# Patient Record
Sex: Female | Born: 1970 | Race: White | Hispanic: No | Marital: Married | Smoking: Never smoker
Health system: Southern US, Community
[De-identification: ages and names within clinical notes are randomized; demographics above are authoritative.]

## PROBLEM LIST (undated history)

## (undated) DIAGNOSIS — T7840XA Allergy, unspecified, initial encounter: Secondary | ICD-10-CM

## (undated) DIAGNOSIS — A048 Other specified bacterial intestinal infections: Secondary | ICD-10-CM

## (undated) DIAGNOSIS — G43909 Migraine, unspecified, not intractable, without status migrainosus: Secondary | ICD-10-CM

## (undated) DIAGNOSIS — E669 Obesity, unspecified: Secondary | ICD-10-CM

## (undated) DIAGNOSIS — I10 Essential (primary) hypertension: Secondary | ICD-10-CM

## (undated) DIAGNOSIS — K219 Gastro-esophageal reflux disease without esophagitis: Secondary | ICD-10-CM

## (undated) DIAGNOSIS — N72 Inflammatory disease of cervix uteri: Secondary | ICD-10-CM

## (undated) DIAGNOSIS — E079 Disorder of thyroid, unspecified: Secondary | ICD-10-CM

## (undated) DIAGNOSIS — D649 Anemia, unspecified: Secondary | ICD-10-CM

## (undated) DIAGNOSIS — E119 Type 2 diabetes mellitus without complications: Secondary | ICD-10-CM

## (undated) DIAGNOSIS — R1031 Right lower quadrant pain: Secondary | ICD-10-CM

## (undated) DIAGNOSIS — J45909 Unspecified asthma, uncomplicated: Secondary | ICD-10-CM

## (undated) DIAGNOSIS — E785 Hyperlipidemia, unspecified: Secondary | ICD-10-CM

## (undated) HISTORY — DX: Allergy, unspecified, initial encounter: T78.40XA

## (undated) HISTORY — DX: Right lower quadrant pain: R10.31

## (undated) HISTORY — DX: Unspecified asthma, uncomplicated: J45.909

## (undated) HISTORY — DX: Obesity, unspecified: E66.9

## (undated) HISTORY — DX: Essential (primary) hypertension: I10

## (undated) HISTORY — PX: UMBILICAL HERNIA REPAIR: SHX196

## (undated) HISTORY — PX: APPENDECTOMY: SHX54

## (undated) HISTORY — DX: Disorder of thyroid, unspecified: E07.9

## (undated) HISTORY — DX: Hyperlipidemia, unspecified: E78.5

## (undated) HISTORY — DX: Type 2 diabetes mellitus without complications: E11.9

## (undated) HISTORY — PX: HERNIA REPAIR: SHX51

## (undated) HISTORY — DX: Anemia, unspecified: D64.9

## (undated) HISTORY — DX: Inflammatory disease of cervix uteri: N72

## (undated) HISTORY — DX: Migraine, unspecified, not intractable, without status migrainosus: G43.909

---

## 1997-11-24 ENCOUNTER — Other Ambulatory Visit: Admission: RE | Admit: 1997-11-24 | Discharge: 1997-11-24 | Payer: Self-pay | Admitting: Obstetrics and Gynecology

## 1997-12-08 ENCOUNTER — Other Ambulatory Visit: Admission: RE | Admit: 1997-12-08 | Discharge: 1997-12-08 | Payer: Self-pay | Admitting: Obstetrics and Gynecology

## 1999-01-20 ENCOUNTER — Other Ambulatory Visit: Admission: RE | Admit: 1999-01-20 | Discharge: 1999-01-20 | Payer: Self-pay | Admitting: Obstetrics and Gynecology

## 1999-03-01 ENCOUNTER — Emergency Department (HOSPITAL_COMMUNITY): Admission: EM | Admit: 1999-03-01 | Discharge: 1999-03-01 | Payer: Self-pay | Admitting: *Deleted

## 1999-03-01 ENCOUNTER — Encounter: Payer: Self-pay | Admitting: Emergency Medicine

## 1999-06-13 HISTORY — PX: LAPAROSCOPY: SHX197

## 1999-10-04 ENCOUNTER — Encounter (INDEPENDENT_AMBULATORY_CARE_PROVIDER_SITE_OTHER): Payer: Self-pay | Admitting: Specialist

## 1999-10-04 ENCOUNTER — Ambulatory Visit (HOSPITAL_COMMUNITY): Admission: RE | Admit: 1999-10-04 | Discharge: 1999-10-04 | Payer: Self-pay | Admitting: Obstetrics and Gynecology

## 1999-12-20 ENCOUNTER — Encounter: Admission: RE | Admit: 1999-12-20 | Discharge: 2000-03-19 | Payer: Self-pay | Admitting: Obstetrics and Gynecology

## 2000-03-22 ENCOUNTER — Other Ambulatory Visit: Admission: RE | Admit: 2000-03-22 | Discharge: 2000-03-22 | Payer: Self-pay | Admitting: Obstetrics and Gynecology

## 2000-07-26 ENCOUNTER — Encounter: Admission: RE | Admit: 2000-07-26 | Discharge: 2000-10-24 | Payer: Self-pay | Admitting: Obstetrics and Gynecology

## 2000-09-17 ENCOUNTER — Ambulatory Visit (HOSPITAL_COMMUNITY): Admission: RE | Admit: 2000-09-17 | Discharge: 2000-09-17 | Payer: Self-pay | Admitting: Obstetrics and Gynecology

## 2000-09-17 ENCOUNTER — Encounter: Payer: Self-pay | Admitting: Obstetrics and Gynecology

## 2000-09-21 ENCOUNTER — Ambulatory Visit (HOSPITAL_COMMUNITY): Admission: RE | Admit: 2000-09-21 | Discharge: 2000-09-21 | Payer: Self-pay | Admitting: Obstetrics and Gynecology

## 2000-09-21 ENCOUNTER — Encounter: Payer: Self-pay | Admitting: Obstetrics and Gynecology

## 2001-02-01 ENCOUNTER — Inpatient Hospital Stay (HOSPITAL_COMMUNITY): Admission: AD | Admit: 2001-02-01 | Discharge: 2001-02-04 | Payer: Self-pay | Admitting: Obstetrics and Gynecology

## 2001-02-01 ENCOUNTER — Inpatient Hospital Stay (HOSPITAL_COMMUNITY): Admission: AD | Admit: 2001-02-01 | Discharge: 2001-02-01 | Payer: Self-pay | Admitting: Obstetrics and Gynecology

## 2001-02-06 ENCOUNTER — Encounter: Admission: RE | Admit: 2001-02-06 | Discharge: 2001-03-08 | Payer: Self-pay | Admitting: Obstetrics and Gynecology

## 2001-03-18 ENCOUNTER — Other Ambulatory Visit: Admission: RE | Admit: 2001-03-18 | Discharge: 2001-03-18 | Payer: Self-pay | Admitting: Obstetrics and Gynecology

## 2001-08-06 ENCOUNTER — Encounter: Admission: RE | Admit: 2001-08-06 | Discharge: 2001-11-04 | Payer: Self-pay | Admitting: Obstetrics and Gynecology

## 2001-08-12 ENCOUNTER — Emergency Department (HOSPITAL_COMMUNITY): Admission: EM | Admit: 2001-08-12 | Discharge: 2001-08-13 | Payer: Self-pay | Admitting: *Deleted

## 2002-03-20 ENCOUNTER — Other Ambulatory Visit: Admission: RE | Admit: 2002-03-20 | Discharge: 2002-03-20 | Payer: Self-pay | Admitting: Obstetrics and Gynecology

## 2003-04-17 ENCOUNTER — Other Ambulatory Visit: Admission: RE | Admit: 2003-04-17 | Discharge: 2003-04-17 | Payer: Self-pay | Admitting: Obstetrics and Gynecology

## 2004-06-30 ENCOUNTER — Other Ambulatory Visit: Admission: RE | Admit: 2004-06-30 | Discharge: 2004-06-30 | Payer: Self-pay | Admitting: Obstetrics and Gynecology

## 2004-10-27 ENCOUNTER — Encounter: Admission: RE | Admit: 2004-10-27 | Discharge: 2005-01-25 | Payer: Self-pay | Admitting: Family Medicine

## 2005-07-06 ENCOUNTER — Other Ambulatory Visit: Admission: RE | Admit: 2005-07-06 | Discharge: 2005-07-06 | Payer: Self-pay | Admitting: Obstetrics and Gynecology

## 2005-10-10 HISTORY — PX: DILATION AND CURETTAGE OF UTERUS: SHX78

## 2005-10-19 ENCOUNTER — Ambulatory Visit (HOSPITAL_COMMUNITY): Admission: RE | Admit: 2005-10-19 | Discharge: 2005-10-19 | Payer: Self-pay | Admitting: Obstetrics and Gynecology

## 2005-10-19 ENCOUNTER — Encounter (INDEPENDENT_AMBULATORY_CARE_PROVIDER_SITE_OTHER): Payer: Self-pay | Admitting: Specialist

## 2006-08-31 ENCOUNTER — Inpatient Hospital Stay (HOSPITAL_COMMUNITY): Admission: RE | Admit: 2006-08-31 | Discharge: 2006-09-02 | Payer: Self-pay | Admitting: Obstetrics and Gynecology

## 2006-09-07 ENCOUNTER — Ambulatory Visit: Payer: Self-pay | Admitting: Vascular Surgery

## 2006-09-07 ENCOUNTER — Ambulatory Visit (HOSPITAL_COMMUNITY): Admission: RE | Admit: 2006-09-07 | Discharge: 2006-09-07 | Payer: Self-pay | Admitting: *Deleted

## 2006-09-07 ENCOUNTER — Encounter: Payer: Self-pay | Admitting: Vascular Surgery

## 2007-11-21 ENCOUNTER — Encounter: Admission: RE | Admit: 2007-11-21 | Discharge: 2007-11-21 | Payer: Self-pay | Admitting: Family Medicine

## 2008-01-28 ENCOUNTER — Ambulatory Visit (HOSPITAL_COMMUNITY): Admission: RE | Admit: 2008-01-28 | Discharge: 2008-01-28 | Payer: Self-pay | Admitting: Gastroenterology

## 2008-02-04 ENCOUNTER — Ambulatory Visit (HOSPITAL_COMMUNITY): Admission: RE | Admit: 2008-02-04 | Discharge: 2008-02-04 | Payer: Self-pay | Admitting: Gastroenterology

## 2008-04-10 ENCOUNTER — Ambulatory Visit (HOSPITAL_COMMUNITY): Admission: RE | Admit: 2008-04-10 | Discharge: 2008-04-10 | Payer: Self-pay | Admitting: General Surgery

## 2009-11-30 ENCOUNTER — Other Ambulatory Visit: Admission: RE | Admit: 2009-11-30 | Discharge: 2009-11-30 | Payer: Self-pay | Admitting: *Deleted

## 2010-02-19 ENCOUNTER — Encounter: Admission: RE | Admit: 2010-02-19 | Discharge: 2010-02-19 | Payer: Self-pay | Admitting: Internal Medicine

## 2010-08-13 ENCOUNTER — Inpatient Hospital Stay (INDEPENDENT_AMBULATORY_CARE_PROVIDER_SITE_OTHER)
Admission: RE | Admit: 2010-08-13 | Discharge: 2010-08-13 | Disposition: A | Discharge status: Home / Self Care | Payer: 59 | Source: Ambulatory Visit | Attending: Family Medicine | Admitting: Family Medicine

## 2010-08-13 DIAGNOSIS — M79609 Pain in unspecified limb: Secondary | ICD-10-CM

## 2010-08-13 DIAGNOSIS — K219 Gastro-esophageal reflux disease without esophagitis: Secondary | ICD-10-CM

## 2010-08-13 DIAGNOSIS — R071 Chest pain on breathing: Secondary | ICD-10-CM

## 2010-08-14 ENCOUNTER — Emergency Department (HOSPITAL_COMMUNITY): Payer: 59

## 2010-08-14 ENCOUNTER — Emergency Department (HOSPITAL_COMMUNITY)
Admission: EM | Admit: 2010-08-14 | Discharge: 2010-08-14 | Disposition: A | Discharge status: Home / Self Care | Payer: 59 | Attending: Emergency Medicine | Admitting: Emergency Medicine

## 2010-08-14 ENCOUNTER — Inpatient Hospital Stay (INDEPENDENT_AMBULATORY_CARE_PROVIDER_SITE_OTHER)
Admission: RE | Admit: 2010-08-14 | Discharge: 2010-08-14 | Disposition: A | Discharge status: Home / Self Care | Payer: 59 | Source: Ambulatory Visit

## 2010-08-14 DIAGNOSIS — F411 Generalized anxiety disorder: Secondary | ICD-10-CM | POA: Insufficient documentation

## 2010-08-14 DIAGNOSIS — R1012 Left upper quadrant pain: Secondary | ICD-10-CM | POA: Insufficient documentation

## 2010-08-14 DIAGNOSIS — R079 Chest pain, unspecified: Secondary | ICD-10-CM

## 2010-08-14 DIAGNOSIS — Z79899 Other long term (current) drug therapy: Secondary | ICD-10-CM | POA: Insufficient documentation

## 2010-08-14 DIAGNOSIS — R071 Chest pain on breathing: Secondary | ICD-10-CM | POA: Insufficient documentation

## 2010-08-14 DIAGNOSIS — R109 Unspecified abdominal pain: Secondary | ICD-10-CM

## 2010-08-14 LAB — CBC
HCT: 38.3 % (ref 36.0–46.0)
Hemoglobin: 13 g/dL (ref 12.0–15.0)
MCH: 29.9 pg (ref 26.0–34.0)
MCV: 88 fL (ref 78.0–100.0)
RBC: 4.35 MIL/uL (ref 3.87–5.11)

## 2010-08-14 LAB — DIFFERENTIAL
Basophils Relative: 0 % (ref 0–1)
Lymphocytes Relative: 20 % (ref 12–46)
Lymphs Abs: 1.4 10*3/uL (ref 0.7–4.0)
Monocytes Absolute: 0.5 10*3/uL (ref 0.1–1.0)
Monocytes Relative: 7 % (ref 3–12)
Neutro Abs: 4.9 10*3/uL (ref 1.7–7.7)
Neutrophils Relative %: 71 % (ref 43–77)

## 2010-08-14 LAB — URINALYSIS, ROUTINE W REFLEX MICROSCOPIC
Bilirubin Urine: NEGATIVE
Nitrite: NEGATIVE
Specific Gravity, Urine: 1.01 (ref 1.005–1.030)
pH: 8.5 — ABNORMAL HIGH (ref 5.0–8.0)

## 2010-08-14 LAB — POCT PREGNANCY, URINE: Preg Test, Ur: NEGATIVE

## 2010-08-14 LAB — POCT CARDIAC MARKERS: Troponin i, poc: <0.05 ng/mL (ref 0.00–0.09)

## 2010-08-14 LAB — COMPREHENSIVE METABOLIC PANEL
AST: 24 U/L (ref 0–37)
Albumin: 4.1 g/dL (ref 3.5–5.2)
Alkaline Phosphatase: 69 U/L (ref 39–117)
BUN: 5 mg/dL — ABNORMAL LOW (ref 6–23)
GFR calc Af Amer: >60 mL/min (ref >60)
Potassium: 3.5 mEq/L (ref 3.5–5.1)
Total Protein: 7.4 g/dL (ref 6.0–8.3)

## 2010-08-14 LAB — D-DIMER, QUANTITATIVE: D-Dimer, Quant: <0.22 ug/mL-FEU (ref 0.00–0.48)

## 2010-08-15 ENCOUNTER — Encounter (INDEPENDENT_AMBULATORY_CARE_PROVIDER_SITE_OTHER): Payer: Self-pay | Admitting: *Deleted

## 2010-08-17 LAB — URINE CULTURE

## 2010-08-23 NOTE — Letter (Signed)
Summary: New Patient letter  [facility]  [address]   [place], [place] [address]   Phone: [phone]  Fax: [phone]       08/15/2010 MRN: [id number]  Yolanda Mendez [address] [place], [place]  [address]  Dear Yolanda Mendez,  Welcome to the Gastroenterology Division at [facility].    You are scheduled to see Dr.  Jarold Motto on 09/15/2010 at 9:00 on the 3rd floor at [facility], [address]  We ask that you try to arrive at our office 15 minutes prior to your appointment time to allow for check-in.  We would like you to complete the enclosed self-administered evaluation form prior to your visit and bring it with you on the day of your appointment.  We will review it with you.  Also, please bring a complete list of all your medications or, if you prefer, bring the medication bottles and we will list them.  Please bring your insurance card so that we may make a copy of it.  If your insurance requires a referral to see a specialist, please bring your referral form from your primary care physician.  Co-payments are due at the time of your visit and may be paid by cash, check or credit card.     Your office visit will consist of a consult with your physician (includes a physical exam), any laboratory testing he/she may order, scheduling of any necessary diagnostic testing (e.g. x-ray, ultrasound, CT-scan), and scheduling of a procedure (e.g. Endoscopy, Colonoscopy) if required.  Please allow enough time on your schedule to allow for any/all of these possibilities.    If you cannot keep your appointment, please call [phone] to cancel or reschedule prior to your appointment date.  This allows [place] the opportunity to schedule an appointment for another patient in need of care.  If you do not cancel or reschedule by 5 p.m. the business day prior to your appointment date, you will be charged a $50.00 late cancellation/no-show fee.    Thank you for  choosing [facility] for your medical needs.  We appreciate the opportunity to care for you.  Please visit [place] at our website  to learn more about our practice.                     Sincerely,                                                             The Gastroenterology Division

## 2010-09-15 ENCOUNTER — Ambulatory Visit: Payer: 59 | Admitting: Gastroenterology

## 2010-09-28 ENCOUNTER — Other Ambulatory Visit: Payer: Self-pay | Admitting: Gastroenterology

## 2010-10-25 NOTE — Op Note (Signed)
Yolanda Mendez, Yolanda Mendez               ACCOUNT NO.:  [id number]   MEDICAL RECORD NO.:  [id number]          PATIENT TYPE:  AMB   LOCATION:  DAY                          FACILITY:  [facility]   PHYSICIAN:  Ollen Gross. Vernell Morgans, M.D. DATE OF BIRTH:  11-23-1970   DATE OF PROCEDURE:  04/10/2008  DATE OF DISCHARGE:                               OPERATIVE REPORT   PREOPERATIVE DIAGNOSIS:  Umbilical hernia.   POSTOPERATIVE DIAGNOSIS:  Umbilical hernia.   PROCEDURE:  Umbilical hernia repair with mesh.   SURGEON:  Ollen Gross. Vernell Morgans, M.D.   ANESTHESIA:  General endotracheal.   PROCEDURE IN DETAIL:  After informed consent was obtained, the patient  was brought to the operating room, placed in supine position on the  operating room table.  After induction of general anesthesia the  patient's abdomen was prepped with Betadine and draped in usual sterile  manner.  A curvilinear incision was made on the underside of the  umbilicus with a 15 blade knife.  This incision was carried down through  the skin and subcutaneous tissue sharply with the electrocautery.  The  hernia sac was identified.  The hernia sac was opened with the  electrocautery.  There was only some omentum bulging through the sac but  it was not adherent to the hernia sac and easily reduced.  The hernia  sac was then excised near the base at the fascia with the  electrocautery.  Subcutaneous dissection on top of the anterior  abdominal wall fascia was then carried out circumferentially around the  fascial defect.  Once this was accomplished the fascia appeared to be  healthy.  The defect was only about a centimeter in diameter.  A 4.3 cm  Proceed patch was chosen.  Four #1 Novofil stitches were brought through  the abdominal wall fascia through the edge of the patch and then back  through the abdominal wall fascia.  This was done in the 12, 3, 6 and 9  o'clock positions.  Once this was accomplished, we were able to bring  the mesh through the  fascial defect and anchor it up against the  abdominal wall on the inside.  A finger was also inserted through the  fascial defect to check the mesh and there did not appear to be anything  adherent to the mesh or caught by the mesh as it was anchored to the  abdominal wall.  Each of these four stitches was then tied down which  gave good apposition of the mesh to the anterior abdominal wall.  The  traction flaps from the mesh were then divided at the fascial level with  the Mayo scissors.  The fascial defect itself was then repaired with  interrupted #1 Novofil stitches incorporating the mesh tails.  Once this  was accomplished the hernia defect was closed and appeared to be well  repaired without any tension.  The wound was irrigated with copious  amounts of saline.  The subcutaneous tissue was then closed with  interrupted 3-0 Vicryl stitches and the skin was closed with a running 4-  0 Monocryl subcuticular  stitch.  A Dermabond dressing was applied.  Once  the Dermabond was dry cotton balls and sterile dressings were then  applied for compression.  She  patient tolerated this procedure well.  At the end of the case all  needle, sponge and instrument counts were correct.  The wound was  infiltrated with 0.25% Marcaine.  The patient was then awakened and  taken to recovery in stable condition.      Ollen Gross. Vernell Morgans, M.D.  Electronically Signed     PST/MEDQ  D:  04/10/2008  T:  04/10/2008  Job:  [id number]

## 2010-10-28 NOTE — H&P (Signed)
[facility]  Patient:    Yolanda Mendez, Yolanda Mendez [facility] Visit Number: [id number] MRN: [id number]          Service Type: OBS Location: [facility] Attending Physician:  Shaune Spittle Dictated by:   Nigel Bridgeman, C.N.M. Adm. Date:  02/01/2001                           History and Physical  HISTORY OF PRESENT ILLNESS:   The patient is a 40 year old, gravida 1, para 0 at 40-5/7 weeks, who presented with prolonged prodromal labor.  She was seen August 23 in maternity admissions, with cervix a loose 1 cm, 75%.  The patient was treated with morphine for therapeutic sedation with a positive result. She was sent home approximately 12:30 p.m.  She returned now with uterine contractions every 2-3 minutes of increased intensity.  Pregnancy has remarkable for:  (1) First trimester bleeding, (2) History of irregular cycles, (3) Asthma.  PRENATAL LABORATORY DATA:     Blood type is A positive.  Rh antibody negative. VDRL nonreactive.  Rubella titer positive.  Hepatitis B surface antigen negative.  Pap was normal. Glucose challenge was normal.  AFP was declined, EDC of January 28, 2001, was established by last menstrual period and was in agreement with ultrasound at approximately 18 weeks.  Hemoglobin upon entry into practice was 12.1, it was 11 at 26 weeks.  Group B streptococcus culture was negative at 36 weeks.  HISTORY OF PRESENT PREGNANCY:                    The patient entered care at approximately 8 weeks.  She did have some pelvic pain early in the pregnancy.  She had issues with work in the early part of her pregnancy.  She had an upper respiratory infection at approximately 18 weeks.  She had some spread glycosuria with normal dextra sticks.  She was started on four hours per day work at 22 weeks. She had mask of pregnancy diagnosed at 32 weeks.  She is referred to a pulmonologist at 34 weeks for continuing cough.  She was diagnosed with asthma.  She was  placed on Serevent, albuterol inhaler and prednisone.  OBSTETRICAL HISTORY:          The patient is a primigravida.  PAST MEDICAL HISTORY:         She was on Zovia until September of 2001. She had a laparoscopic procedure in April of 2001.  Her other surgeries include an appendectomy in 1991.  Wisdom teeth removed in 1993.  Her laparoscopic procedure was done to remove ovarian cyst.  She has occasionally yeast infections after antibiotics.  She reports the usual childhood illnesses.  She has a ulcers and IBS.  She has a history of frequent UTIs. She had kidney infection as a child.  She has migraines.  She broke her collar bone in 1990.  She had extreme nausea waking up from anesthesia at one point.  FAMILY HISTORY:               Her maternal grandmother had an MI.  Her mother, father, maternal grandfather and paternal grandfather all have hypertension. Her maternal grandmother has varicosities.  Her paternal grandfather had non-insulin dependent diabetes.  Maternal grandfather had diabetes.  Maternal grandfather had testicular cancer.  Maternal grandfather had multiple CVAs. Maternal grandmother is a smoker and alcohol user.  Maternal grandfather has a history of smoking and alcohol use.  Paternal grandfather has a history of smoking.  GENETIC HISTORY:              Unremarkable.  SOCIAL HISTORY:               The patient is married to the father of the baby.  He is involved and supportive.  His name is Leamon Arnt.  The patient is Caucasian, of the [place] faith.  She has graduate education.  She is employed at [employer].  Her husband also has a Naval architect.  He is employed as an Art gallery manager.  She has been followed by the [facility], [facility].  She denies any alcohol, drug or tobacco use during this pregnancy.  PHYSICAL EXAMINATION:  VITAL SIGNS:                  Vital signs are stable.  The patient is afebrile.  HEENT:                        Within  normal limits.  LUNGS:                        Bilateral breath sounds are clear.  HEART:                        Regular rate and rhythm without murmur.  BREASTS:                      Soft and nontender.  ABDOMEN:                      Fundal height is approximately 39 cm.  Estimated fetal weight is 8 to 8-1/2 pounds.  Uterine contractions are now every 4 to 6 minutes, moderate quality.  PELVIC:                       Cervix exam 2-3 cm, 80% vertex at a -1 station with bulging bag of waters.  EXTREMITIES:                  Deep tendon reflexes are 2+ without clonus. There is trace a edema noted.  IMPRESSION:                   1. Intrauterine pregnancy, at 40-5/7 weeks.                               2. Prolonged prodromal labor.  PLAN:                         1. Admit to the birthing suite for consult with                                  Dr. Jaymes Graff, who is attending                                  physician.                               2. Routine physician orders.  3. Plan Nubain 10 mg and Phenergan 25 mg IV                                  for rest.                               4. M.D.s will follow.                               5. The patient desires epidural as labor                                  advances. Dictated by:   Nigel Bridgeman, C.N.M. Attending Physician:  Shaune Spittle DD:  02/02/01 TD:  02/02/01 Job: [id number]

## 2010-10-28 NOTE — Op Note (Signed)
[facility]  Patient:    Yolanda Mendez, Yolanda Mendez                MRN: [id number] Proc. Date: 10/04/99 Adm. Date:  [id number] Attending:  Dierdre Forth Pearline                           Operative Report  PREOPERATIVE DIAGNOSIS:       Pelvic pain and dyspareunia.  POSTOPERATIVE DIAGNOSIS:      Pelvic pain and dyspareunia, rule out endometriosis.  OPERATION:                    Diagnostic laparoscopy, peritoneal biopsies.  ANESTHESIA:                   General orotracheal.  ESTIMATED BLOOD LOSS:         Less than 10 cc.  COMPLICATIONS:                None.  FINDINGS:                     The uterus, tubes, and ovaries appeared normal. There was a thin band of peritoneum that crossed the anterior cul-de-sac from one side to the other without other adhesions.  In the posterior cul-de-sac there was a minimally opaque area beneath the left uterosacral ligaments that had a stellate pattern.  No other evidence of endometriosis or adhesions was noted.  The patient is status post appendectomy and the cecal stump was visualized.  The liver edge and gallbladder appeared normal.  PROCEDURE:                    Patient was taken to the operating room and placed on the operating table after appropriate identification.  After the obtainment of adequate general anesthesia, she was placed in the modified lithotomy position.  The abdomen, perineum, and vagina were prepped with multiple layers of Betadine.  A Foley catheter was inserted into the bladder and connected to straight drainage.  A single tooth tenaculum was placed on the anterior cervix.  The abdomen was draped as a sterile field.  The subumbilical and suprapubic regions were infiltrated with a total of 10 cc of 0.25% Marcaine.  A subumbilical incision was made and a Veress cannula placed through that incision into the peritoneal cavity.  A pneumoperitoneum was created with 3.5 L of CO2.  The  ______ Veress cannula was removed and the laparoscopic trocar placed through that incision into the peritoneal cavity.  The laparoscope was placed through the trocar sleeve.  A suprapubic incision was made to the left of midline and a laparoscopic probe trocar placed through that incision into the peritoneal cavity under direct visualization. The above noted findings were made and biopsies obtained from the anterior cul-de-sac peritoneal band and from the posterior cul-de-sac left uterosacral region.  Approximately 100 cc of warm lactated Ringers was placed in the pelvis. Hemostasis was noted to be adequate.  All instruments were removed from the peritoneal cavity under direct visualization as the CO2 was allowed to escape. The subumbilical and suprapubic incisions were closed with subcuticular sutures of -0 Vicryl.  Sterile dressings were applied and the single tooth tenaculum and Foley removed.  The patient was awakened from general anesthesia and taken to the recovery room in satisfactory condition having tolerated the procedure well with sponge and instruments counts correct.DD:  10/04/99 TD:  10/04/99 Job: [id number]

## 2010-10-28 NOTE — Op Note (Signed)
Yolanda Mendez, Yolanda Mendez               ACCOUNT NO.:  [id number]   MEDICAL RECORD NO.:  [id number]          PATIENT TYPE:  AMB   LOCATION:  SDC                           FACILITY:  [facility]   PHYSICIAN:  Osborn Coho, M.D.   DATE OF BIRTH:  03-13-1971   DATE OF PROCEDURE:  10/19/2005  DATE OF DISCHARGE:                                 OPERATIVE REPORT   PREOPERATIVE DIAGNOSIS:  Missed abortion.   POSTOPERATIVE DIAGNOSIS:  Missed abortion.   PROCEDURE:  Suction dilation and curettage.   ATTENDING:  Osborn Coho, M.D.   ANESTHESIA:  General via LMA.   SPECIMENS TO PATHOLOGY:  Products of conception.   FLUIDS:  1000 cc.   URINE OUTPUT:  Straight catheterization prior to procedure.   ESTIMATED BLOOD LOSS:  Minimal.   COMPLICATIONS:  None.   FINDINGS:  Small amount of POCs.   PROCEDURE:  The patient was taken to the operating room after the risks,  benefits and alternatives were reviewed with the patient.  The patient  verbalized understanding, and consent signed and witnessed.  The patient was  placed under general anesthesia and prepped and draped in the normal sterile  fashion.  A bivalve speculum placed in the patient's vagina, and a  paracervical block administered using a total of 10 cc of 1% lidocaine.  The  uterus was sounded to 12 cm and anterior lip of the cervix grasped with a  single-tooth tenaculum.  The cervix was dilated for passage of the size 10  suction curet.  Suction curettage was performed and sharp curettage  performed until a gritty texture  was noted.  Suction curettage performed  once again to remove any remaining debris.  The tenaculum was removed so the  nitrate was applied for hemostasis at the right tenaculum site.  Hemostasis  was noted.  The patient tolerated the procedure well and was returned to the  recovery room in good condition.      Osborn Coho, M.D.  Electronically Signed    AR/MEDQ  D:  10/19/2005  T:  10/20/2005  Job:  [id number]

## 2011-03-14 LAB — BASIC METABOLIC PANEL
BUN: 7
CO2: 28
Chloride: 105
Glucose, Bld: 85
Potassium: 3.8

## 2011-03-14 LAB — DIFFERENTIAL
Basophils Absolute: 0
Eosinophils Absolute: 0.1
Eosinophils Relative: 2
Lymphs Abs: 1.7

## 2011-03-14 LAB — CBC
HCT: 39.6
MCV: 93.8
Platelets: 268
RDW: 12.6

## 2011-03-14 LAB — PREGNANCY, URINE: Preg Test, Ur: NEGATIVE

## 2011-12-06 ENCOUNTER — Other Ambulatory Visit: Payer: Self-pay | Admitting: Family Medicine

## 2011-12-06 DIAGNOSIS — R1031 Right lower quadrant pain: Secondary | ICD-10-CM

## 2011-12-07 ENCOUNTER — Ambulatory Visit
Admission: RE | Admit: 2011-12-07 | Discharge: 2011-12-07 | Disposition: A | Discharge status: Home / Self Care | Payer: 59 | Source: Ambulatory Visit | Attending: Family Medicine | Admitting: Family Medicine

## 2011-12-07 DIAGNOSIS — R1031 Right lower quadrant pain: Secondary | ICD-10-CM

## 2011-12-11 ENCOUNTER — Other Ambulatory Visit: Payer: 59

## 2012-10-11 ENCOUNTER — Other Ambulatory Visit: Payer: Self-pay | Admitting: Family Medicine

## 2012-10-11 DIAGNOSIS — Z1231 Encounter for screening mammogram for malignant neoplasm of breast: Secondary | ICD-10-CM

## 2012-10-14 ENCOUNTER — Other Ambulatory Visit: Payer: Self-pay | Admitting: Family Medicine

## 2012-10-14 DIAGNOSIS — R1011 Right upper quadrant pain: Secondary | ICD-10-CM

## 2012-10-17 ENCOUNTER — Ambulatory Visit
Admission: RE | Admit: 2012-10-17 | Discharge: 2012-10-17 | Disposition: A | Discharge status: Home / Self Care | Payer: No Typology Code available for payment source | Source: Ambulatory Visit | Attending: Family Medicine | Admitting: Family Medicine

## 2012-10-17 DIAGNOSIS — R1011 Right upper quadrant pain: Secondary | ICD-10-CM

## 2012-10-18 ENCOUNTER — Other Ambulatory Visit: Payer: 59

## 2012-11-05 ENCOUNTER — Emergency Department (HOSPITAL_COMMUNITY)
Admission: EM | Admit: 2012-11-05 | Discharge: 2012-11-05 | Disposition: A | Discharge status: Home / Self Care | Payer: No Typology Code available for payment source | Attending: Emergency Medicine | Admitting: Emergency Medicine

## 2012-11-05 ENCOUNTER — Encounter (HOSPITAL_COMMUNITY): Payer: Self-pay | Admitting: *Deleted

## 2012-11-05 DIAGNOSIS — K219 Gastro-esophageal reflux disease without esophagitis: Secondary | ICD-10-CM | POA: Insufficient documentation

## 2012-11-05 DIAGNOSIS — R61 Generalized hyperhidrosis: Secondary | ICD-10-CM | POA: Insufficient documentation

## 2012-11-05 DIAGNOSIS — R109 Unspecified abdominal pain: Secondary | ICD-10-CM

## 2012-11-05 DIAGNOSIS — R197 Diarrhea, unspecified: Secondary | ICD-10-CM | POA: Insufficient documentation

## 2012-11-05 DIAGNOSIS — Z9089 Acquired absence of other organs: Secondary | ICD-10-CM | POA: Insufficient documentation

## 2012-11-05 DIAGNOSIS — R5383 Other fatigue: Secondary | ICD-10-CM | POA: Insufficient documentation

## 2012-11-05 DIAGNOSIS — A048 Other specified bacterial intestinal infections: Secondary | ICD-10-CM | POA: Insufficient documentation

## 2012-11-05 DIAGNOSIS — R112 Nausea with vomiting, unspecified: Secondary | ICD-10-CM

## 2012-11-05 DIAGNOSIS — Z79899 Other long term (current) drug therapy: Secondary | ICD-10-CM | POA: Insufficient documentation

## 2012-11-05 DIAGNOSIS — Z9889 Other specified postprocedural states: Secondary | ICD-10-CM | POA: Insufficient documentation

## 2012-11-05 DIAGNOSIS — Z8619 Personal history of other infectious and parasitic diseases: Secondary | ICD-10-CM | POA: Insufficient documentation

## 2012-11-05 DIAGNOSIS — R5381 Other malaise: Secondary | ICD-10-CM | POA: Insufficient documentation

## 2012-11-05 HISTORY — DX: Gastro-esophageal reflux disease without esophagitis: K21.9

## 2012-11-05 HISTORY — DX: Other specified bacterial intestinal infections: A04.8

## 2012-11-05 LAB — COMPREHENSIVE METABOLIC PANEL WITH GFR
CO2: 19 meq/L (ref 19–32)
Calcium: 9 mg/dL (ref 8.4–10.5)
Creatinine, Ser: 0.64 mg/dL (ref 0.50–1.10)
GFR calc Af Amer: >90 mL/min (ref >90)
GFR calc non Af Amer: >90 mL/min (ref >90)
Glucose, Bld: 101 mg/dL — ABNORMAL HIGH (ref 70–99)

## 2012-11-05 LAB — COMPREHENSIVE METABOLIC PANEL
ALT: 16 U/L (ref 0–35)
AST: 23 U/L (ref 0–37)
Albumin: 3.9 g/dL (ref 3.5–5.2)
Alkaline Phosphatase: 75 U/L (ref 39–117)
BUN: 6 mg/dL (ref 6–23)
Chloride: 103 mEq/L (ref 96–112)
Potassium: 3.9 mEq/L (ref 3.5–5.1)
Sodium: 138 mEq/L (ref 135–145)
Total Bilirubin: 0.3 mg/dL (ref 0.3–1.2)
Total Protein: 7.9 g/dL (ref 6.0–8.3)

## 2012-11-05 LAB — LIPASE, BLOOD: Lipase: 39 U/L (ref 11–59)

## 2012-11-05 LAB — CBC WITH DIFFERENTIAL/PLATELET
Basophils Absolute: 0 10*3/uL (ref 0.0–0.1)
Basophils Relative: 0 % (ref 0–1)
Eosinophils Absolute: 0.1 10*3/uL (ref 0.0–0.7)
Eosinophils Relative: 1 % (ref 0–5)
HCT: 38 % (ref 36.0–46.0)
Hemoglobin: 12.7 g/dL (ref 12.0–15.0)
Lymphocytes Relative: 15 % (ref 12–46)
Lymphs Abs: 1.2 K/uL (ref 0.7–4.0)
MCH: 28.1 pg (ref 26.0–34.0)
MCHC: 33.4 g/dL (ref 30.0–36.0)
MCV: 84.1 fL (ref 78.0–100.0)
Monocytes Absolute: 0.4 K/uL (ref 0.1–1.0)
Monocytes Relative: 5 % (ref 3–12)
Neutro Abs: 6.4 10*3/uL (ref 1.7–7.7)
Neutrophils Relative %: 79 % — ABNORMAL HIGH (ref 43–77)
Platelets: 347 10*3/uL (ref 150–400)
RBC: 4.52 MIL/uL (ref 3.87–5.11)
RDW: 14.3 % (ref 11.5–15.5)
WBC: 8.1 K/uL (ref 4.0–10.5)

## 2012-11-05 LAB — CG4 I-STAT (LACTIC ACID): Lactic Acid, Venous: 1.21 mmol/L (ref 0.5–2.2)

## 2012-11-05 MED ORDER — MORPHINE SULFATE 4 MG/ML IJ SOLN
4.0000 mg | Freq: Once | INTRAMUSCULAR | Status: AC
  Administered 2012-11-05: 4 mg via INTRAVENOUS
  Filled 2012-11-05: qty 1

## 2012-11-05 MED ORDER — HYDROCODONE-ACETAMINOPHEN 5-325 MG PO TABS: ORAL_TABLET | ORAL | Status: DC

## 2012-11-05 MED ORDER — SODIUM CHLORIDE 0.9 % IV BOLUS (SEPSIS)
1000.0000 mL | Freq: Once | INTRAVENOUS | Status: AC
  Administered 2012-11-05: 1000 mL via INTRAVENOUS

## 2012-11-05 MED ORDER — ONDANSETRON HCL 4 MG/2ML IJ SOLN
4.0000 mg | Freq: Once | INTRAMUSCULAR | Status: AC
  Administered 2012-11-05: 4 mg via INTRAVENOUS
  Filled 2012-11-05: qty 2

## 2012-11-05 MED ORDER — DIPHENOXYLATE-ATROPINE 2.5-0.025 MG PO TABS: 1.0000 | ORAL_TABLET | Freq: Four times a day (QID) | ORAL | Status: DC | PRN

## 2012-11-05 NOTE — ED Provider Notes (Signed)
History     CSN: [id number]  Arrival date & time 11/05/12  0809   First MD Initiated Contact with Patient 11/05/12 [phone]      Chief Complaint  Patient presents with  . Nausea  . Emesis  . Diarrhea    (Consider location/radiation/quality/duration/timing/severity/associated sxs/prior treatment) HPI Comments: Pt with recent travel to [place] over the weekend, came home last night, began having frequent episodes, multiple of lower abd pain, cramping and watery, non bloody diarrhea, almost every 30 minutes.  This AM, had N/V.  No back pain, dysuria, freq, no vaginal bleeding or discharge. She was recently treated with H. Pylori and has had a neg endoscopy 1 year ago with Dr. Madilyn Fireman in the past.  Tried 3 doses of imodium last night with no relief.  Feels fatigued, weak.  Abd pain is severe.  Doesn't drink alcohol, no smoking or drug use. Had an appendectomy years ago.      Patient is a 42 y.o. female presenting with vomiting and diarrhea. The history is provided by the patient and a relative.  Emesis Severity:  Moderate Duration:  2 hours Timing:  Intermittent Quality:  Stomach contents Associated symptoms: abdominal pain and diarrhea   Associated symptoms: no chills   Abdominal pain:    Location:  Suprapubic, LLQ and RLQ   Quality:  Throbbing, cramping and shooting   Onset quality:  Sudden   Duration:  12 hours   Progression:  Unchanged   Chronicity:  New Diarrhea:    Quality:  Watery   Number of occurrences:  20   Severity:  Severe   Duration:  12 hours   Timing:  Constant   Progression:  Unchanged Risk factors: prior abdominal surgery   Risk factors: no alcohol use, no diabetes, not pregnant now, no sick contacts, no suspect food intake and no travel to endemic areas   Diarrhea Associated symptoms: abdominal pain and vomiting   Associated symptoms: no chills and no fever     Past Medical History  Diagnosis Date  . H. pylori infection   . GERD (gastroesophageal  reflux disease)     Past Surgical History  Procedure Laterality Date  . Appendectomy    . Umbilical hernia repair      History reviewed. No pertinent family history.  History  Substance Use Topics  . Smoking status: Never Smoker   . Smokeless tobacco: Not on file  . Alcohol Use: No    OB History   Grav Para Term Preterm Abortions TAB SAB Ect Mult Living                  Review of Systems  Constitutional: Positive for appetite change and fatigue. Negative for fever and chills.  Gastrointestinal: Positive for nausea, vomiting, abdominal pain and diarrhea. Negative for constipation and blood in stool.  Skin: Negative for rash.  Neurological: Negative for syncope.  All other systems reviewed and are negative.    Allergies  Bactrim; Keflex; and Sulfa antibiotics  Home Medications   Current Outpatient Rx  Name  Route  Sig  Dispense  Refill  . cyclobenzaprine (FLEXERIL) 10 MG tablet   Oral   Take 10 mg by mouth 3 (three) times daily as needed for muscle spasms.         Marland Kitchen dexlansoprazole (DEXILANT) 60 MG capsule   Oral   Take 60 mg by mouth daily.         Marland Kitchen loperamide (IMODIUM) 2 MG capsule   Oral  Take 4 mg by mouth 4 (four) times daily as needed for diarrhea or loose stools.         . norgestrel-ethinyl estradiol (CRYSELLE-28) 0.3-30 MG-MCG tablet   Oral   Take 1 tablet by mouth daily.         . Vitamin D, Ergocalciferol, (DRISDOL) 50000 UNITS CAPS   Oral   Take 50,000 Units by mouth every 7 (seven) days. Takes on Mondays.         . diphenoxylate-atropine (LOMOTIL) 2.5-0.025 MG per tablet   Oral   Take 1 tablet by mouth 4 (four) times daily as needed for diarrhea or loose stools.   30 tablet   0   . HYDROcodone-acetaminophen (NORCO/VICODIN) 5-325 MG per tablet      1-2 tablets po q 6 hours prn moderate to severe pain   15 tablet   0     BP 122/66  Pulse 76  Temp(Src) 98.5 F (36.9 C) (Oral)  Resp 17  Ht 5\' 6"  (1.676 m)  Wt 250 lb  (113.399 kg)  BMI 40.37 kg/m2  SpO2 99%  LMP 10/06/2012  Physical Exam  Nursing note and vitals reviewed. Constitutional: She is oriented to person, place, and time. She appears well-developed and well-nourished. No distress.  HENT:  Head: Normocephalic and atraumatic.  Eyes: EOM are normal. No scleral icterus.  Neck: Neck supple.  Cardiovascular: Normal rate, regular rhythm and intact distal pulses.   Pulmonary/Chest: Effort normal. No respiratory distress. She has no wheezes.  Abdominal: Soft. Normal appearance. Bowel sounds are increased. There is tenderness in the suprapubic area and left lower quadrant. There is no rebound and no CVA tenderness.    Musculoskeletal: She exhibits no tenderness.  Neurological: She is alert and oriented to person, place, and time. She exhibits normal muscle tone. Coordination and gait normal.  Skin: No rash noted. She is diaphoretic. There is pallor.  Psychiatric: She has a normal mood and affect.    ED Course  Procedures (including critical care time)  Labs Reviewed  CBC WITH DIFFERENTIAL - Abnormal; Notable for the following:    Neutrophils Relative % 79 (*)    All other components within normal limits  COMPREHENSIVE METABOLIC PANEL - Abnormal; Notable for the following:    Glucose, Bld 101 (*)    All other components within normal limits  LIPASE, BLOOD  CG4 I-STAT (LACTIC ACID)   No results found.   1. Nausea vomiting and diarrhea   2. Abdominal cramping     Ra sat is 99% and I interpret to be normal  9:51 AM Pt's abd pain is much improved after IVF's and morphine.  Diarrhea is still occurring.  WBC is normal.  Doubt sig infection or surgical issue.  Will likely be able to be discharged to home if can tolerate PO's.  Abd remains soft, no guarding.     11:33 AM Lower abd pain did return, but I think consistent with recurring crampy discomfort related to ongoing gastroenteritis.  Pt is ok being discharged.  No rebound or guard.   Will d/c home and pt understands to return if worse and to follow up with PCP closely this week.   MDM  Pt with mild lower abd tenderness without rebound, profuse watery diarrhea.  Some vomiting, no fever.  I suspect gastroenteritis along with mild to moderate dehydration.  Will hydrate, treat symptoms and perform serial abd exams.  Doubt surgical abdomen.  Possibly diverticulitis but given frequency of diarrhea, I favor gastroenteritis.  Gavin Pound. Nel Stoneking, MD 11/05/12 1133

## 2012-11-05 NOTE — ED Notes (Signed)
C/o lower abd pain, n/v/d started last night. Diagnosed with H. Pylori a month ago

## 2012-11-05 NOTE — Discharge Instructions (Signed)
 Abdominal Pain (Nonspecific) Your exam might not show the exact reason you have abdominal pain. Since there are many different causes of abdominal pain, another checkup and more tests may be needed. It is very important to follow up for lasting (persistent) or worsening symptoms. A possible cause of abdominal pain in any person who still has his or her appendix is acute appendicitis. Appendicitis is often hard to diagnose. Normal blood tests, urine tests, ultrasound, and CT scans do not completely rule out early appendicitis or other causes of abdominal pain. Sometimes, only the changes that happen over time will allow appendicitis and other causes of abdominal pain to be determined. Other potential problems that may require surgery may also take time to become more apparent. Because of this, it is important that you follow all of the instructions below. HOME CARE INSTRUCTIONS   Rest as much as possible.  Do not eat solid food until your pain is gone.  While adults or children have pain: A diet of water , weak decaffeinated tea, broth or bouillon, gelatin, oral rehydration solutions (ORS), frozen ice pops, or ice chips may be helpful.  When pain is gone in adults or children: Start a light diet (dry toast, crackers, applesauce, or white rice). Increase the diet slowly as long as it does not bother you. Eat no dairy products (including cheese and eggs) and no spicy, fatty, fried, or high-fiber foods.  Use no alcohol, caffeine, or cigarettes.  Take your regular medicines unless your caregiver told you not to.  Take any prescribed medicine as directed.  Only take over-the-counter or prescription medicines for pain, discomfort, or fever as directed by your caregiver. Do not give aspirin  to children. If your caregiver has given you a follow-up appointment, it is very important to keep that appointment. Not keeping the appointment could result in a permanent injury and/or lasting (chronic) pain and/or  disability. If there is any problem keeping the appointment, you must call to reschedule.  SEEK IMMEDIATE MEDICAL CARE IF:   Your pain is not gone in 24 hours.  Your pain becomes worse, changes location, or feels different.  You or your child has an oral temperature above 102 F (38.9 C), not controlled by medicine.  Your baby is older than 3 months with a rectal temperature of 102 F (38.9 C) or higher.  Your baby is 70 months old or younger with a rectal temperature of 100.4 F (38 C) or higher.  You have shaking chills.  You keep throwing up (vomiting) or cannot drink liquids.  There is blood in your vomit or you see blood in your bowel movements.  Your bowel movements become dark or black.  You have frequent bowel movements.  Your bowel movements stop (become blocked) or you cannot pass gas.  You have bloody, frequent, or painful urination.  You have yellow discoloration in the skin or whites of the eyes.  Your stomach becomes bloated or bigger.  You have dizziness or fainting.  You have chest or back pain. MAKE SURE YOU:   Understand these instructions.  Will watch your condition.  Will get help right away if you are not doing well or get worse. Document Released: 05/29/2005 Document Revised: 08/21/2011 Document Reviewed: 04/26/2009 [facility] Patient Information 2014 [place], [place].   Narcotic and benzodiazepine use may cause drowsiness, slowed breathing or dependence.  Please use with caution and do not drive, operate machinery or watch young children alone while taking them.  Taking combinations of these medications or drinking  alcohol will potentiate these effects.

## 2012-11-05 NOTE — ED Notes (Signed)
Reports diarrhea started last night 2200, then n/v started this am 0600. Last diarrhea PTA, last emesis PTA. States lower abd cramping worse with diarrhea. Denies bloody stools or emesis. Was recently on antibiotics for H. Pylori, finished them 3 weeks ago

## 2012-11-05 NOTE — ED Notes (Signed)
Lactic acid results called to primary nurse [facility]

## 2013-05-05 ENCOUNTER — Emergency Department (HOSPITAL_COMMUNITY): Payer: No Typology Code available for payment source

## 2013-05-05 ENCOUNTER — Encounter (HOSPITAL_COMMUNITY): Payer: Self-pay | Admitting: Emergency Medicine

## 2013-05-05 ENCOUNTER — Emergency Department (HOSPITAL_COMMUNITY)
Admission: EM | Admit: 2013-05-05 | Discharge: 2013-05-06 | Disposition: A | Discharge status: Home / Self Care | Payer: No Typology Code available for payment source | Attending: Emergency Medicine | Admitting: Emergency Medicine

## 2013-05-05 DIAGNOSIS — F411 Generalized anxiety disorder: Secondary | ICD-10-CM | POA: Insufficient documentation

## 2013-05-05 DIAGNOSIS — Z8619 Personal history of other infectious and parasitic diseases: Secondary | ICD-10-CM | POA: Insufficient documentation

## 2013-05-05 DIAGNOSIS — R1031 Right lower quadrant pain: Secondary | ICD-10-CM | POA: Insufficient documentation

## 2013-05-05 DIAGNOSIS — R109 Unspecified abdominal pain: Secondary | ICD-10-CM

## 2013-05-05 DIAGNOSIS — K219 Gastro-esophageal reflux disease without esophagitis: Secondary | ICD-10-CM | POA: Insufficient documentation

## 2013-05-05 DIAGNOSIS — R110 Nausea: Secondary | ICD-10-CM | POA: Insufficient documentation

## 2013-05-05 DIAGNOSIS — R1011 Right upper quadrant pain: Secondary | ICD-10-CM | POA: Insufficient documentation

## 2013-05-05 DIAGNOSIS — Z3202 Encounter for pregnancy test, result negative: Secondary | ICD-10-CM | POA: Insufficient documentation

## 2013-05-05 DIAGNOSIS — Z79899 Other long term (current) drug therapy: Secondary | ICD-10-CM | POA: Insufficient documentation

## 2013-05-05 LAB — CBC
HCT: 35.9 % — ABNORMAL LOW (ref 36.0–46.0)
MCV: 83.1 fL (ref 78.0–100.0)
RBC: 4.32 MIL/uL (ref 3.87–5.11)
RDW: 14.2 % (ref 11.5–15.5)
WBC: 10.6 10*3/uL — ABNORMAL HIGH (ref 4.0–10.5)

## 2013-05-05 LAB — COMPREHENSIVE METABOLIC PANEL
AST: 20 U/L (ref 0–37)
Albumin: 3.9 g/dL (ref 3.5–5.2)
Alkaline Phosphatase: 74 U/L (ref 39–117)
BUN: 7 mg/dL (ref 6–23)
CO2: 23 mEq/L (ref 19–32)
Chloride: 103 mEq/L (ref 96–112)
Creatinine, Ser: 0.77 mg/dL (ref 0.50–1.10)
GFR calc non Af Amer: >90 mL/min (ref >90)
Potassium: 3.6 mEq/L (ref 3.5–5.1)
Total Bilirubin: 0.1 mg/dL — ABNORMAL LOW (ref 0.3–1.2)

## 2013-05-05 LAB — URINALYSIS, ROUTINE W REFLEX MICROSCOPIC
Bilirubin Urine: NEGATIVE
Glucose, UA: NEGATIVE mg/dL
Hgb urine dipstick: NEGATIVE
Specific Gravity, Urine: 1.012 (ref 1.005–1.030)
Urobilinogen, UA: 0.2 mg/dL (ref 0.0–1.0)
pH: 6 (ref 5.0–8.0)

## 2013-05-05 LAB — POCT PREGNANCY, URINE: Preg Test, Ur: NEGATIVE

## 2013-05-05 MED ORDER — LORAZEPAM 1 MG PO TABS
ORAL_TABLET | ORAL | Status: AC
  Administered 2013-05-05: 1 mg via ORAL
  Filled 2013-05-05: qty 1

## 2013-05-05 MED ORDER — OXYCODONE-ACETAMINOPHEN 5-325 MG PO TABS
2.0000 | ORAL_TABLET | Freq: Once | ORAL | Status: AC
  Administered 2013-05-05: 2 via ORAL
  Filled 2013-05-05: qty 2

## 2013-05-05 MED ORDER — LORAZEPAM 1 MG PO TABS
1.0000 mg | ORAL_TABLET | Freq: Once | ORAL | Status: AC
  Filled 2013-05-05: qty 1

## 2013-05-05 NOTE — ED Notes (Signed)
Pt presents with pelvic pain. Pt says the pain really started in May but has become unbearable since she had her pelvic exam this past Friday. Pt denies discharge or vaginal bleeding. Pt also denies any urinary issues. Pt has had some nausea but no vomiting or diarrhea.

## 2013-05-05 NOTE — ED Provider Notes (Signed)
CSN: [id number]     Arrival date & time 05/05/13  1830 History   First MD Initiated Contact with Patient 05/05/13 2053     Chief Complaint  Patient presents with  . Pelvic Pain   (Consider location/radiation/quality/duration/timing/severity/associated sxs/prior Treatment) HPI Comments: Patient is a 42 year old obese female with a past medical history of GERD who presents to the emergency department complaining of worsening abdominal pain x3 days. Patient states she's been doing intermittent pelvic and abdominal pain since May, possibly related to GERD and H. pylori, she is followed by gastroenterology. On Friday she went to her OB/GYN for evaluation of pelvic pain, after having a pelvic exam her pain increased. She is scheduled to get a pelvic ultrasound next month. Pain is constant, radiating throughout her abdomen, unrelieved by ibuprofen. Admits to associated nausea when the pain becomes severe. Denies fever, chills, appetite change, diarrhea, vaginal bleeding or discharge, increased urinary frequency, urgency or dysuria. In 2001 she had an exploratory laparotomy to evaluate for endometriosis which was not present. Hx of appendectomy.  Patient is a 42 y.o. female presenting with pelvic pain. The history is provided by the patient.  Pelvic Pain Associated symptoms include abdominal pain and nausea.    Past Medical History  Diagnosis Date  . H. pylori infection   . GERD (gastroesophageal reflux disease)    Past Surgical History  Procedure Laterality Date  . Appendectomy    . Umbilical hernia repair     No family history on file. History  Substance Use Topics  . Smoking status: Never Smoker   . Smokeless tobacco: Never Used  . Alcohol Use: No   OB History   Grav Para Term Preterm Abortions TAB SAB Ect Mult Living                 Review of Systems  Gastrointestinal: Positive for nausea and abdominal pain.  Genitourinary: Positive for pelvic pain.  All other systems reviewed  and are negative.    Allergies  Bactrim; Keflex; and Sulfa antibiotics  Home Medications   Current Outpatient Rx  Name  Route  Sig  Dispense  Refill  . cyclobenzaprine (FLEXERIL) 10 MG tablet   Oral   Take 5-10 mg by mouth 3 (three) times daily as needed for muscle spasms.          Marland Kitchen dexlansoprazole (DEXILANT) 60 MG capsule   Oral   Take 60 mg by mouth daily.         Marland Kitchen LORazepam (ATIVAN) 0.5 MG tablet   Oral   Take 0.5 mg by mouth every 4 (four) hours as needed for anxiety (anxiety).         . norgestrel-ethinyl estradiol (CRYSELLE-28) 0.3-30 MG-MCG tablet   Oral   Take 1 tablet by mouth daily.         . Vitamin D, Ergocalciferol, (DRISDOL) 50000 UNITS CAPS   Oral   Take 50,000 Units by mouth every 7 (seven) days. Takes on sunday          BP 141/74  Pulse 86  Temp(Src) 98.6 F (37 C) (Oral)  Resp 18  SpO2 98%  LMP 04/25/2013 Physical Exam  Nursing note and vitals reviewed. Constitutional: She is oriented to person, place, and time. She appears well-developed and well-nourished. No distress.  HENT:  Head: Normocephalic and atraumatic.  Mouth/Throat: Oropharynx is clear and moist.  Eyes: Conjunctivae are normal.  Neck: Normal range of motion. Neck supple.  Cardiovascular: Normal rate, regular rhythm and normal heart  sounds.   Pulmonary/Chest: Effort normal and breath sounds normal.  Abdominal: Soft. Normal appearance and bowel sounds are normal. She exhibits no distension and no mass. There is tenderness in the right upper quadrant and right lower quadrant. There is no rigidity, no rebound, no guarding and negative Murphy's sign.  No peritoneal signs.  Genitourinary: Cervix exhibits no motion tenderness, no discharge and no friability. No erythema, tenderness or bleeding around the vagina. No vaginal discharge found.  Exam of uterus/adnexa limited by patient's body habitus.  Musculoskeletal: Normal range of motion. She exhibits no edema.  Neurological:  She is alert and oriented to person, place, and time.  Skin: Skin is warm and dry. She is not diaphoretic.  Psychiatric: Her behavior is normal. Her mood appears anxious.    ED Course  Procedures (including critical care time) Labs Review Labs Reviewed  CBC - Abnormal; Notable for the following:    WBC 10.6 (*)    HCT 35.9 (*)    All other components within normal limits  COMPREHENSIVE METABOLIC PANEL - Abnormal; Notable for the following:    Glucose, Bld 112 (*)    Total Bilirubin 0.1 (*)    All other components within normal limits  URINALYSIS, ROUTINE W REFLEX MICROSCOPIC  POCT PREGNANCY, URINE   Imaging Review US Abdomen Complete  05/06/2013   CLINICAL DATA:  Right upper quadrant pain.  EXAM: ULTRASOUND ABDOMEN COMPLETE  COMPARISON:  10/17/2012  FINDINGS: Gallbladder  No gallstones or wall thickening visualized. No sonographic Murphy sign noted.  Common bile duct  Diameter: 6 mm  Liver  Mild hepatic steatosis again noted.  No focal liver mass identified.  IVC  No abnormality visualized.  Pancreas  Visualized portion unremarkable.  Spleen  Size and appearance within normal limits.  Right Kidney  Length: 11.7 cm. Echogenicity within normal limits. No mass or hydronephrosis visualized.  Left Kidney  Length: 11.8 cm. Echogenicity within normal limits. No mass or hydronephrosis visualized.  Abdominal aorta  No aneurysm visualized.  IMPRESSION: No evidence of gallstones, biliary dilatation, or other acute findings.  Stable mild hepatic steatosis.   Electronically Signed   By: Myles Rosenthal M.D.   On: 05/06/2013 00:05   US Transvaginal Non-ob  05/06/2013   CLINICAL DATA:  Right-sided pelvic pain.  EXAM: TRANSABDOMINAL AND TRANSVAGINAL ULTRASOUND OF PELVIS  TECHNIQUE: Both transabdominal and transvaginal ultrasound examinations of the pelvis were performed. Transabdominal technique was performed for global imaging of the pelvis including uterus, ovaries, adnexal regions, and pelvic cul-de-sac.  It was necessary to proceed with endovaginal exam following the transabdominal exam to visualize the endometrium and ovaries.  COMPARISON:  None  FINDINGS: Uterus  Measurements: 8.0 x 3.8 x 4.4 cm. No fibroids or other mass visualized.  Endometrium  Thickness: 8 mm.  No focal abnormality visualized.  Right ovary  Measurements: Not directly visualized by transabdominal or transvaginal sonography, however no adnexal mass identified.  Left ovary  Measurements: Not directly visualized by transabdominal or transvaginal sonography, however no adnexal mass identified.  Other findings  No free fluid.  IMPRESSION: Normal size uterus.  No fibroids identified.  Nonvisualization of ovaries, however no adnexal mass identified.   Electronically Signed   By: Myles Rosenthal M.D.   On: 05/06/2013 00:10   US Pelvis Complete  05/06/2013   CLINICAL DATA:  Right-sided pelvic pain.  EXAM: TRANSABDOMINAL AND TRANSVAGINAL ULTRASOUND OF PELVIS  TECHNIQUE: Both transabdominal and transvaginal ultrasound examinations of the pelvis were performed. Transabdominal technique was performed for global  imaging of the pelvis including uterus, ovaries, adnexal regions, and pelvic cul-de-sac. It was necessary to proceed with endovaginal exam following the transabdominal exam to visualize the endometrium and ovaries.  COMPARISON:  None  FINDINGS: Uterus  Measurements: 8.0 x 3.8 x 4.4 cm. No fibroids or other mass visualized.  Endometrium  Thickness: 8 mm.  No focal abnormality visualized.  Right ovary  Measurements: Not directly visualized by transabdominal or transvaginal sonography, however no adnexal mass identified.  Left ovary  Measurements: Not directly visualized by transabdominal or transvaginal sonography, however no adnexal mass identified.  Other findings  No free fluid.  IMPRESSION: Normal size uterus.  No fibroids identified.  Nonvisualization of ovaries, however no adnexal mass identified.   Electronically Signed   By: Myles Rosenthal M.D.    On: 05/06/2013 00:10    EKG Interpretation   None       MDM   1. Abdominal pain   2. GERD (gastroesophageal reflux disease)     Patient with abdominal pain and pelvic pain with associated nausea. Saw her OB/GYN on Friday, pain increased sense. On exam, tenderness to right upper quadrant, epigastric, right lower quadrant without peritoneal signs. Pelvic exam limited by patient's body habitus. Will obtain pelvic and abdominal ultrasound, evaluate uterus and adnexa, right upper quadrant pain. Patient very anxious. Hx of appendectomy and exploratory laparotomy (2001). 12:22 AM Pelvic ultrasound and abdominal ultrasound both without any acute findings. No acute findings on labs. Symptoms most likely from GERD. I advised her to f/u with both GI and OB/GYN. She is stable for discharge. Tolerating PO. Return precautions given. Patient states understanding of treatment care plan and is agreeable.   Trevor Mace, PA-C 05/06/13 [phone]

## 2013-05-07 NOTE — ED Provider Notes (Signed)
Medical screening examination/treatment/procedure(s) were performed by non-physician practitioner and as supervising physician I was immediately available for consultation/collaboration.  EKG Interpretation   None         Layla Maw Ward, DO 05/07/13 [phone]

## 2013-05-23 ENCOUNTER — Other Ambulatory Visit: Payer: Self-pay | Admitting: Obstetrics

## 2013-05-23 DIAGNOSIS — R1033 Periumbilical pain: Secondary | ICD-10-CM

## 2013-05-23 DIAGNOSIS — R102 Pelvic and perineal pain: Secondary | ICD-10-CM

## 2013-05-23 DIAGNOSIS — Z8719 Personal history of other diseases of the digestive system: Secondary | ICD-10-CM

## 2013-05-27 ENCOUNTER — Ambulatory Visit
Admission: RE | Admit: 2013-05-27 | Discharge: 2013-05-27 | Disposition: A | Discharge status: Home / Self Care | Payer: No Typology Code available for payment source | Source: Ambulatory Visit | Attending: Obstetrics | Admitting: Obstetrics

## 2013-05-27 DIAGNOSIS — R102 Pelvic and perineal pain: Secondary | ICD-10-CM

## 2013-05-27 DIAGNOSIS — R1033 Periumbilical pain: Secondary | ICD-10-CM

## 2013-05-27 DIAGNOSIS — Z8719 Personal history of other diseases of the digestive system: Secondary | ICD-10-CM

## 2013-05-27 MED ORDER — IOHEXOL 300 MG/ML  SOLN
125.0000 mL | Freq: Once | INTRAMUSCULAR | Status: AC | PRN
  Administered 2013-05-27: 125 mL via INTRAVENOUS

## 2013-05-28 ENCOUNTER — Encounter (INDEPENDENT_AMBULATORY_CARE_PROVIDER_SITE_OTHER): Payer: Self-pay | Admitting: General Surgery

## 2013-06-13 ENCOUNTER — Ambulatory Visit (INDEPENDENT_AMBULATORY_CARE_PROVIDER_SITE_OTHER): Payer: Medicare Other | Admitting: General Surgery

## 2014-09-30 IMAGING — US US ABDOMEN COMPLETE
1 series · 14 of 25 positions shown · non-contrast
Comparison: CT, 12/07/2011.  Ultrasound, 08/14/2010.

CLINICAL DATA: Abdominal pain for 6 weeks.

COMPLETE ABDOMINAL ULTRASOUND

[Series 1: us abdomen complete · 0.28mm/px · 14 of 62 slices shown]
[im 1/62]
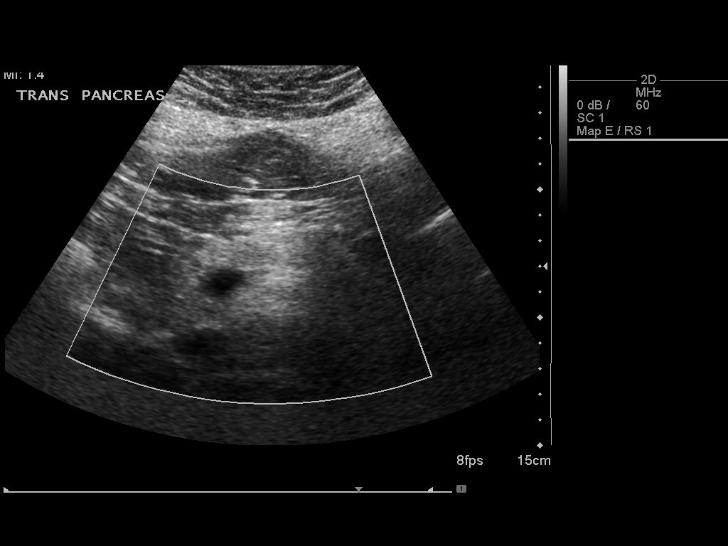
[im 6/62]
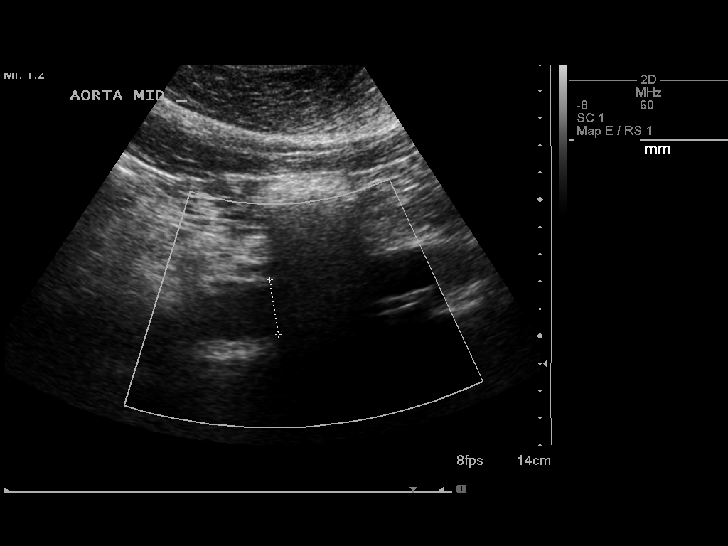
[im 11/62]
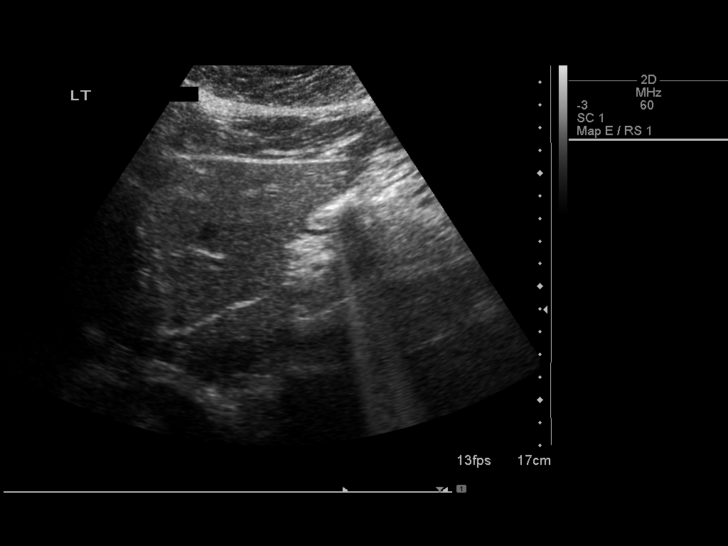
[im 16/62]
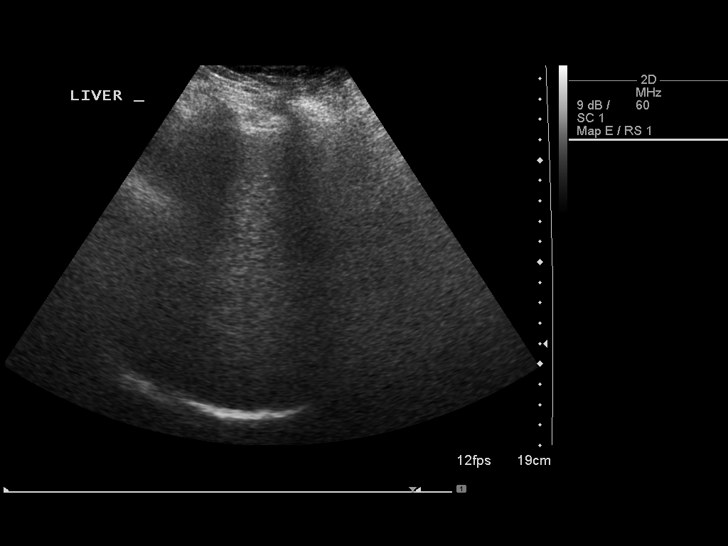
[im 21/62]
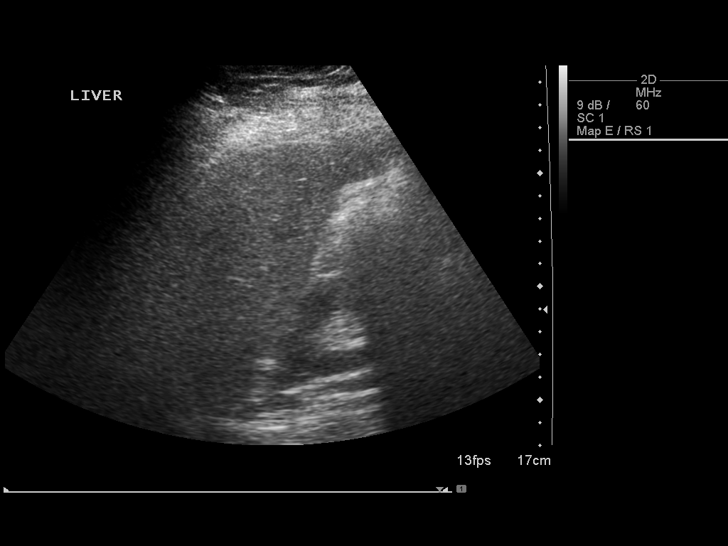
[im 23/62]
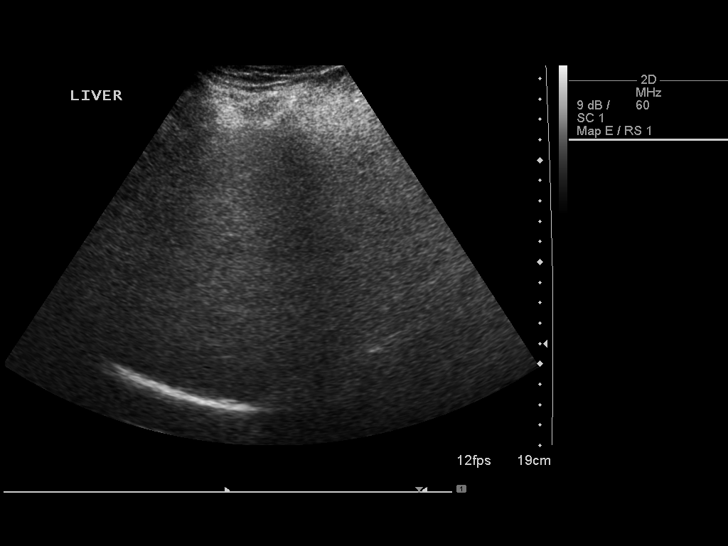
[im 28/62]
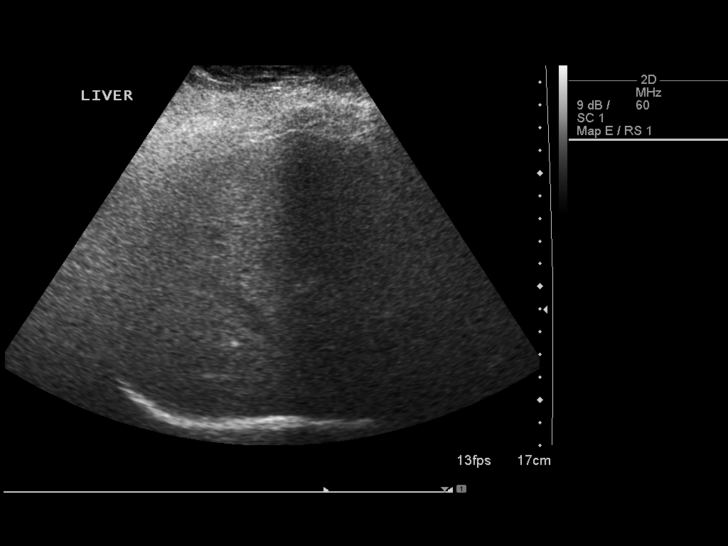
[im 34/62]
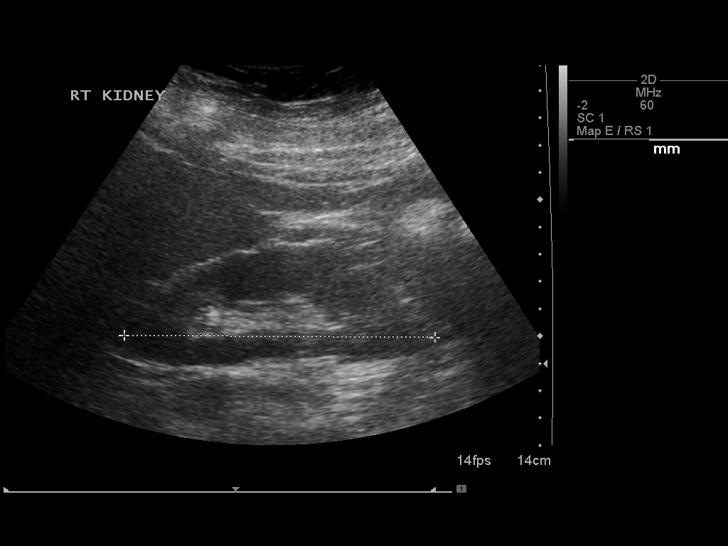
[im 39/62]
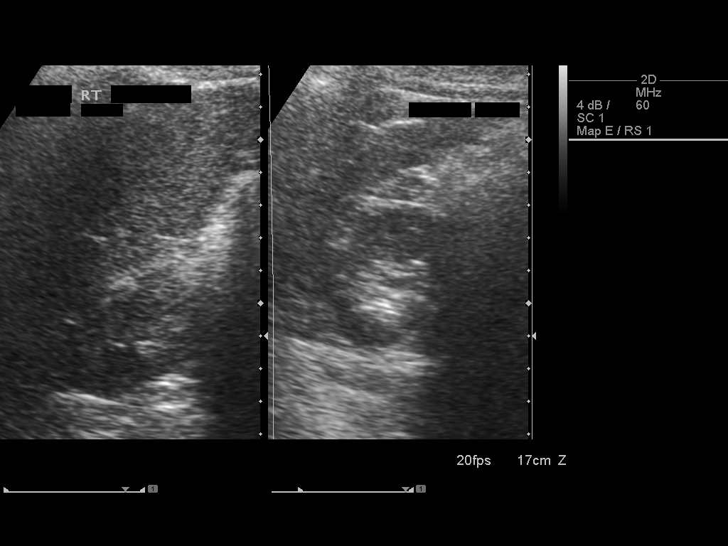
[im 41/62]
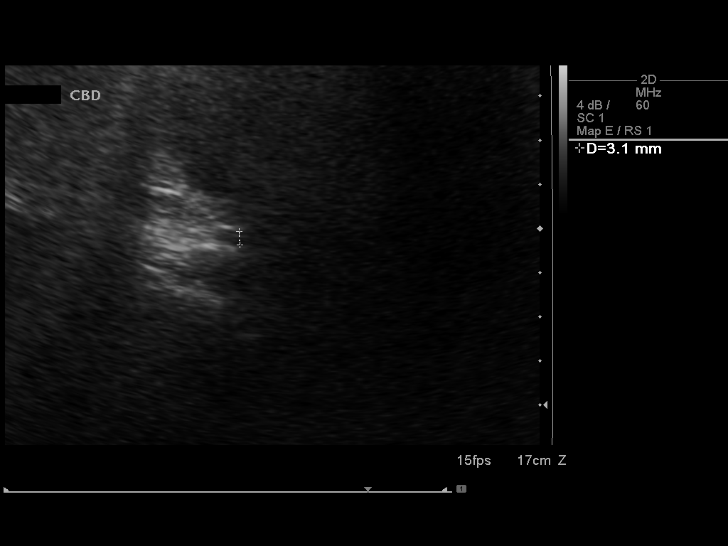
[im 46/62]
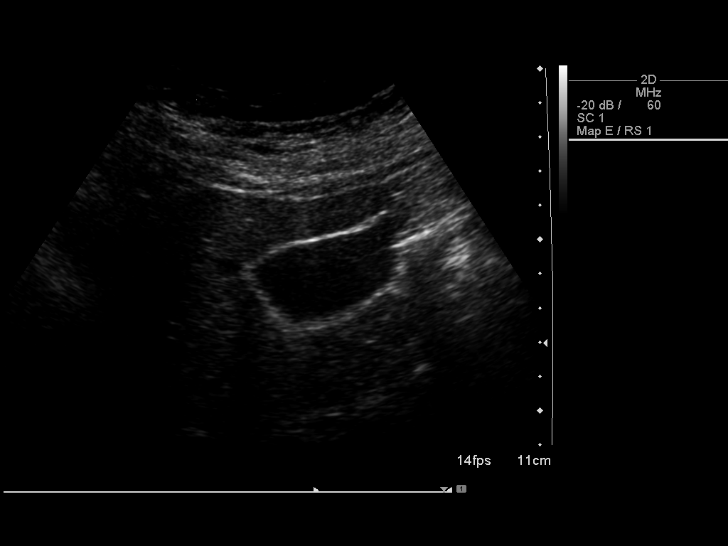
[im 51/62]
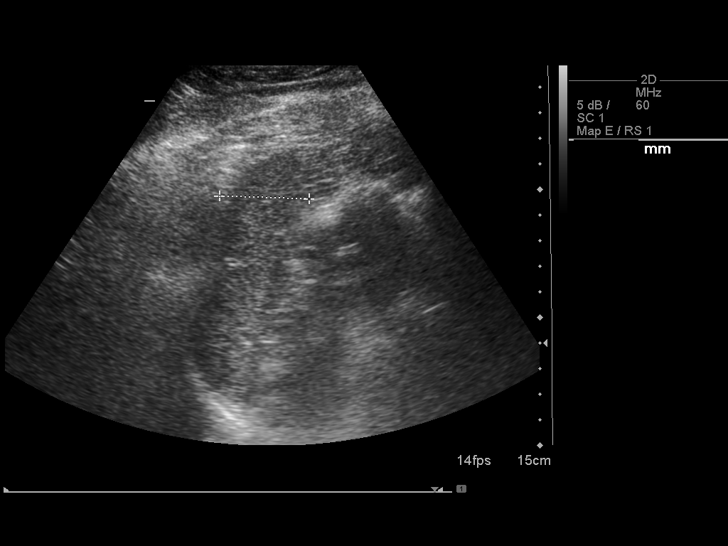
[im 56/62]
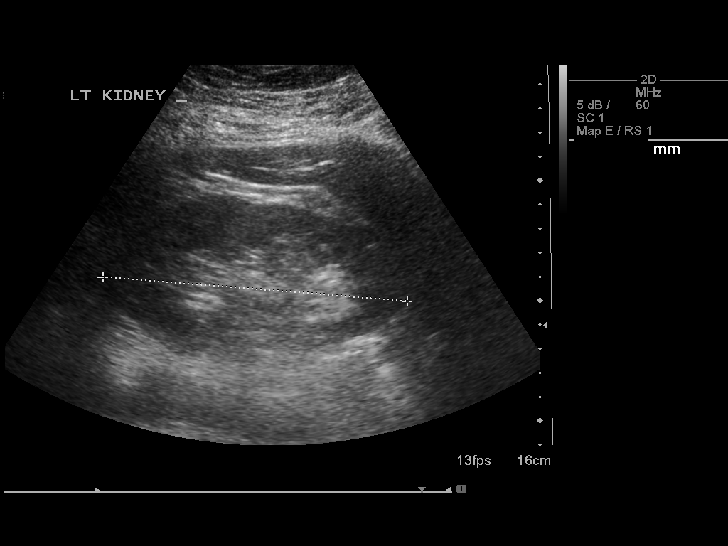
[im 62/62]
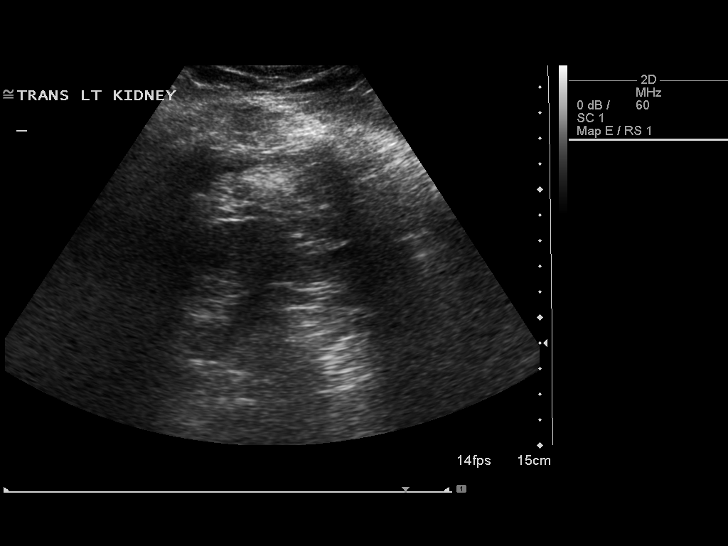

[14 of 25 positions shown; findings below may reference images not displayed]

FINDINGS: Gallbladder:  No gallstones, gallbladder wall thickening, or
pericholecystic fluid.

Common bile duct:  Normal in caliber with no stones.  Duct measures
3.1 mm.

Liver:  Mild diffuse increase in echogenicity suggesting fatty
infiltration.  Liver is normal in size with no mass or focal
lesion.

IVC:  Appears normal.

Pancreas:  No focal abnormality seen.

Spleen:  Not well defined due to adjacent bowel gas, but normal in
overall size and echogenicity.

Right Kidney:  Normal in size and echogenicity measuring 11.4 cm.
No hydronephrosis.

Left Kidney:  Normal in size and echogenicity measuring 12.7 cm.
No hydronephrosis.

Abdominal aorta:  No aneurysm identified.
IMPRESSION: No acute findings.  Evidence of fatty infiltration of the liver.
No other abnormalities.  Specifically, normal gallbladder and no
bile duct dilation.

## 2015-04-08 ENCOUNTER — Encounter: Payer: Self-pay | Admitting: Family Medicine

## 2015-04-08 ENCOUNTER — Ambulatory Visit (INDEPENDENT_AMBULATORY_CARE_PROVIDER_SITE_OTHER): Payer: BLUE CROSS/BLUE SHIELD | Admitting: Family Medicine

## 2015-04-08 NOTE — Progress Notes (Signed)
Medical Nutrition Therapy:  Appt start time: 1600 end time:  1700.  Assessment:  Primary concerns today: Weight management.   Yolanda Mendez was accompanied by her 14-YO son Yolanda Mendez, 8-YO daughter Yolanda Mendez, and husband Yolanda Mendez.  Although she has been working on weight loss and improved nutrition, she is frustrated that she has been unable to get her kids on board with dietary changes she feels would benefit them all.  I answered several Qs and clarified a number of issues, i.e., frozen and fresh veg's are more nutrient-dense than canned; all fruits and veg's are good sources of K+.   Time constraints and busy schedules are challenges to better nutrition for the family.    Usual eating pattern includes 2-3 meals and 0-1 snacks per day.  Yolanda Mendez sometimes misses lunch b/c of time constraints and lack of advance planning.   Frequent foods and beverages include pizza, [employer] (when travelling), pasta w/ cheese, cheese, [employer] salads, broccoli, beans, fruits.  Avoided foods include meat, crab, lobster, asparagus.    Partial 24-hr recall: (Up at  AM) B ( AM)-   2 eggs, 2 toaster waffles, spray butter, 2 T syrup, 1 c sk milk, coffee w/ 3 T creamer Snk ( AM)-    L ( PM)-  2 devilled eggs, green beans Snk ( PM)-   D ( PM)-   Snk ( PM)-     Progress Towards Goal(s):  In progress.   Nutritional Diagnosis:  NB-1.1 Food and nutrition-related knowledge deficit As related to weight management and family nutrition.  As evidenced by expressed confusion about appropriate food choices.    Intervention:  Nutrition education.  Handouts given during visit include:  AVS  Demonstrated degree of understanding via:  Teach Back   Monitoring/Evaluation:  Dietary intake, exercise, and body weight prn.

## 2015-04-08 NOTE — Patient Instructions (Addendum)
Recommended:  [place].  How to Get Your Kid to Eat, But Not Too Much.  "The Division of Responsibility."     - TASTE PREFERENCES ARE LEARNED.  This means that it will get easier to choose foods you know are good for you if you are exposed to them enough.   - Portion Control:  - Obtain twice as many veg's as protein or carbohydrate foods for both lunch and dinner.  - Fresh or frozen veg's are best.    - Pause before starting your meal; fork down between bites; sip fluids only when your mouth is empty; SLOW DOWN!  - 3 Qs of a good food decision (both satisfying and nutritious):   1. How hungry am I?   2. What am I in the mood for?   3. What's good for me? - Low blood sugar:  Make sure you start your day with a good amount of protein AND HIGH-FIBER carb's (at least 5 g of carb per serving), e.g., yogurt with a cup of berries.  In addition, make sure each meal has significant protein in it, and that all your other carb foods are as high-fiber as possible.  Make sure you have snacks on hand as needed; fresh fruit will be one of the best, maybe with 2-4 tbsp nuts/seeds or 1-2 tbsp of nut butter.   - Email [email or web address] if you are interested in the Weight Management class that will start Mon, Jan 30.

## 2015-04-18 IMAGING — US US ABDOMEN COMPLETE
1 series · 14 of 25 positions shown · non-contrast
Comparison: 10/17/2012

CLINICAL DATA: Right upper quadrant pain.

EXAM:
ULTRASOUND ABDOMEN COMPLETE

[Series 1: us abdomen complete · 0.24mm/px · 14 of 75 slices shown]
[im 1/75]
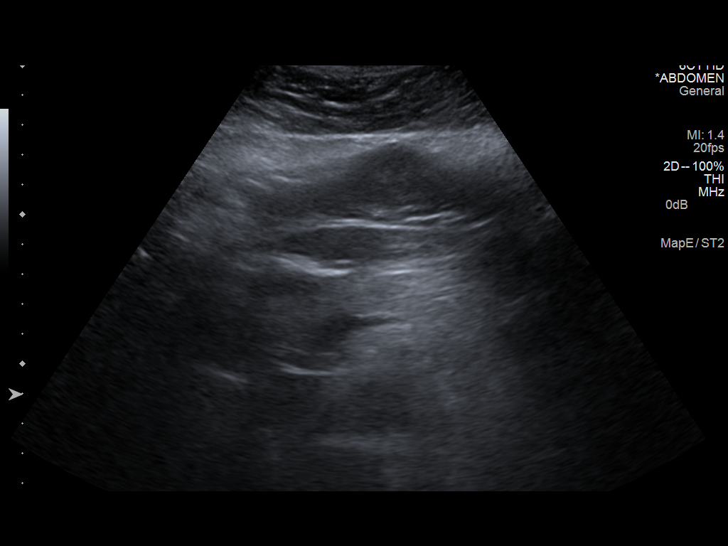
[im 7/75]
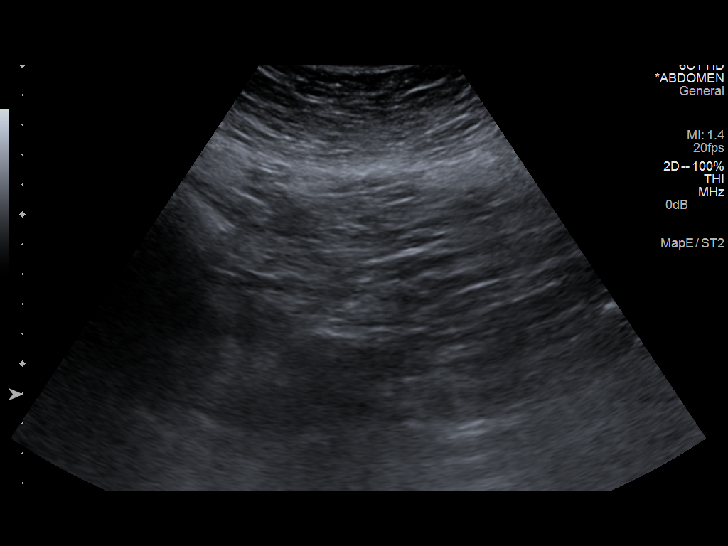
[im 13/75]
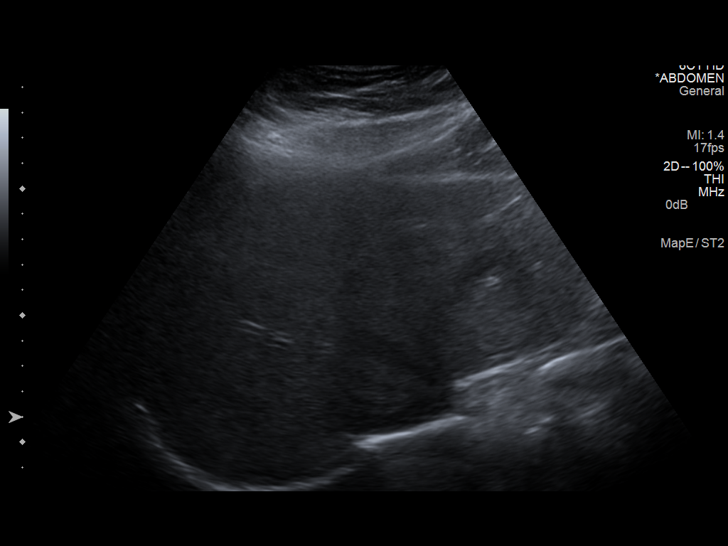
[im 19/75]
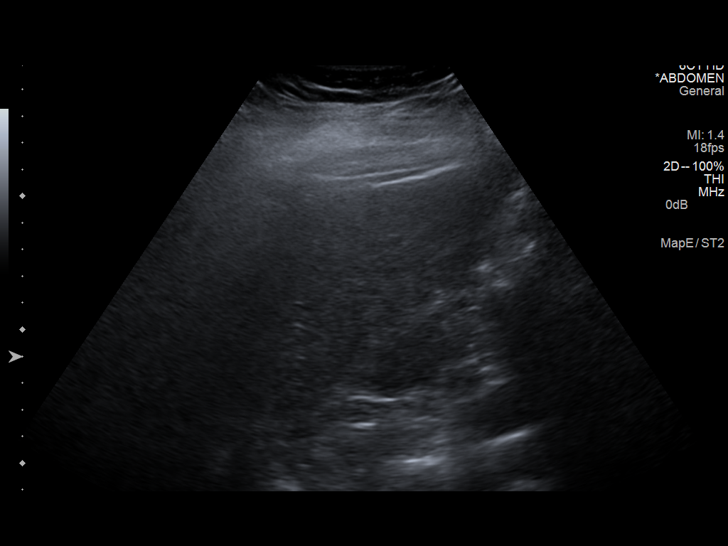
[im 25/75]
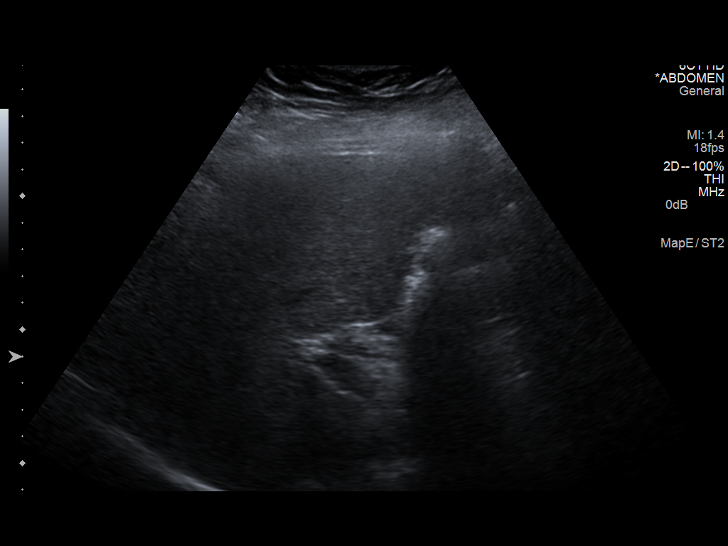
[im 28/75]
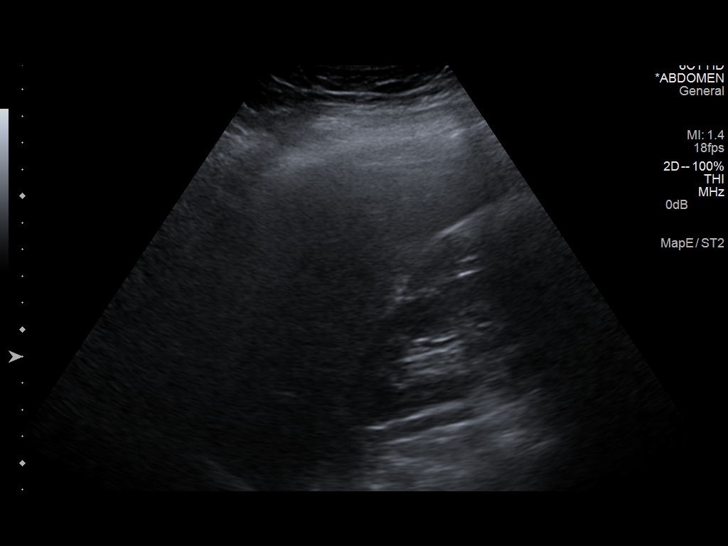
[im 34/75]
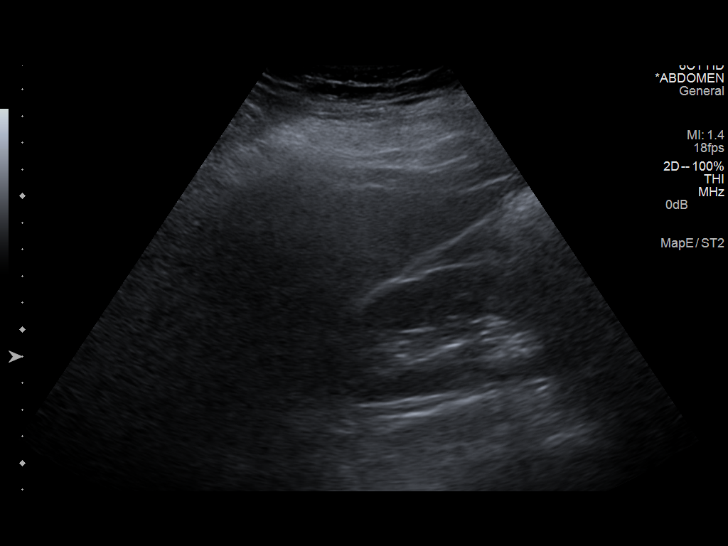
[im 41/75]
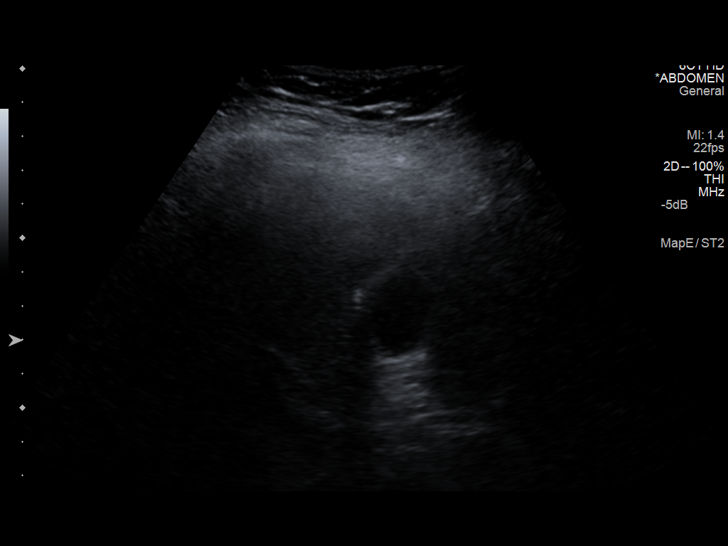
[im 47/75]
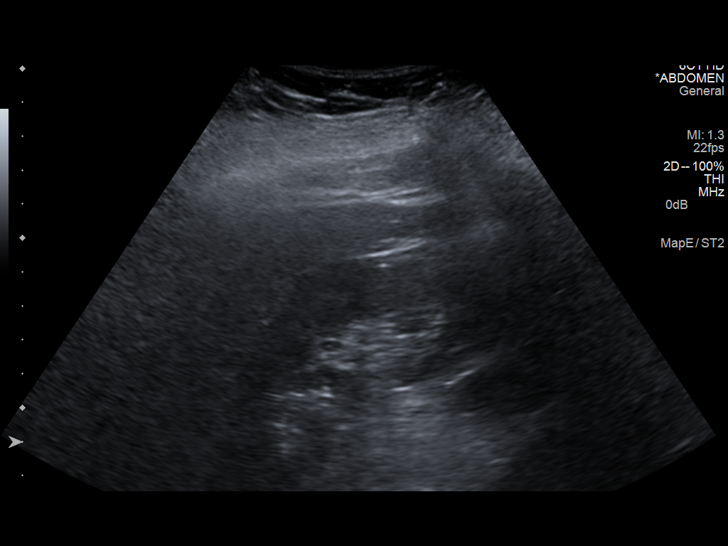
[im 50/75]
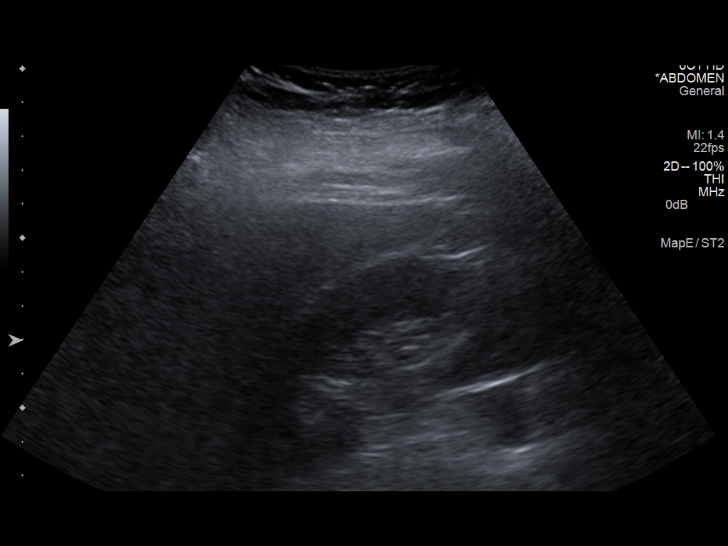
[im 56/75]
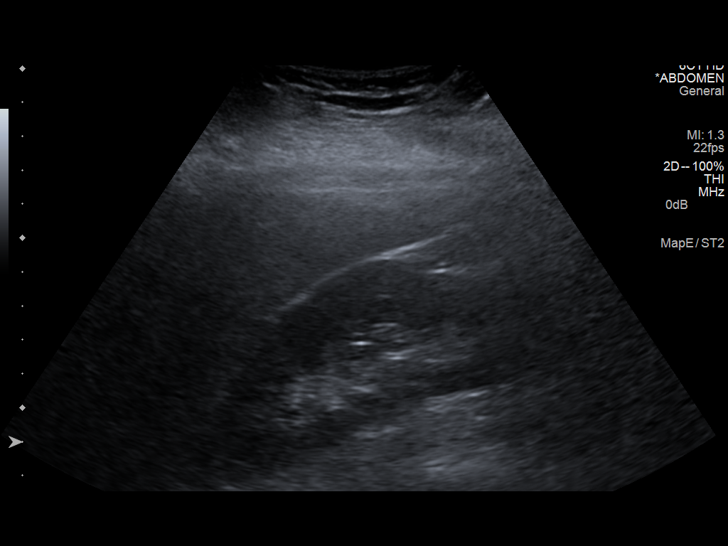
[im 62/75]
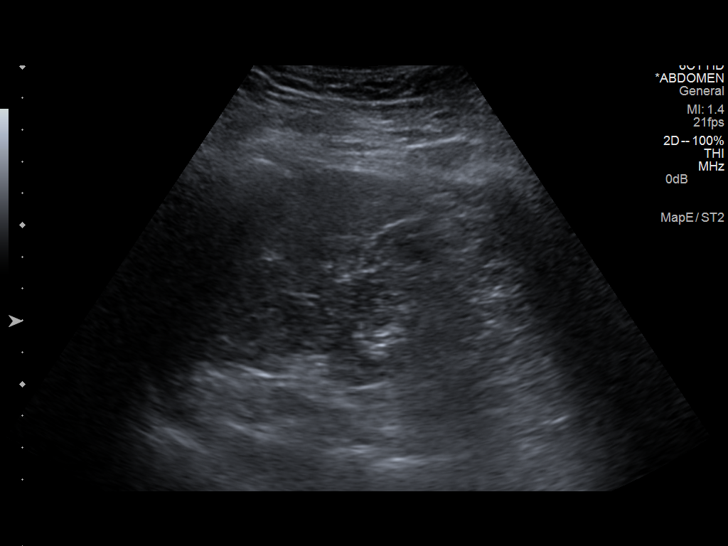
[im 68/75]
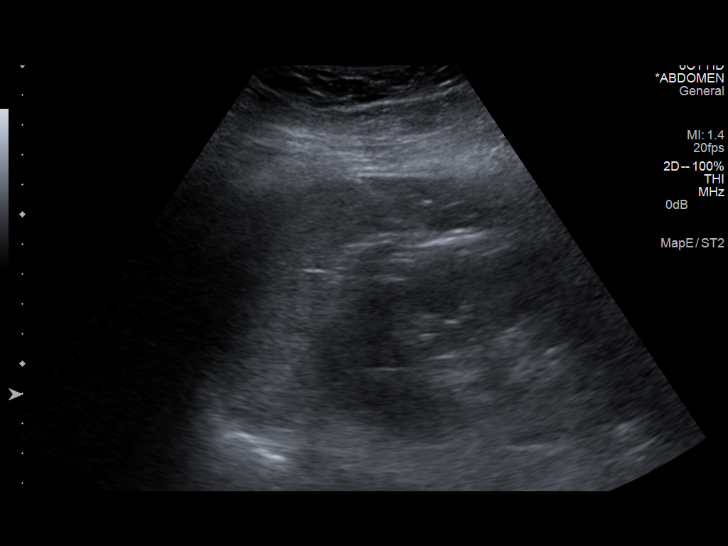
[im 75/75]
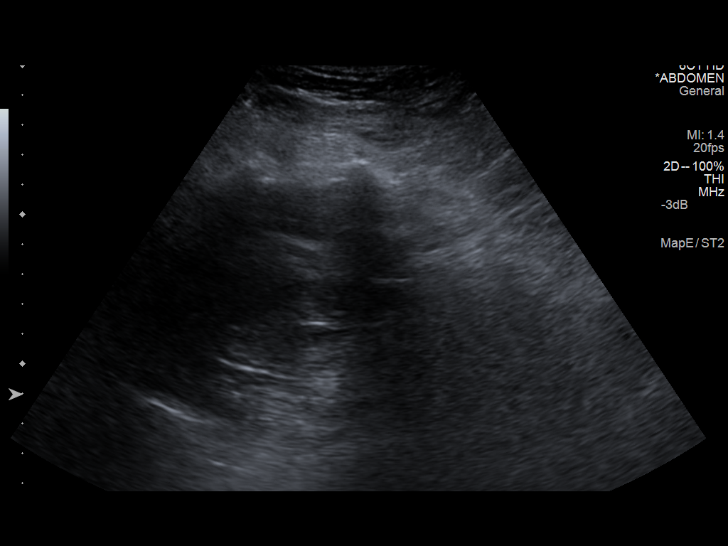

[14 of 25 positions shown; findings below may reference images not displayed]

FINDINGS: Gallbladder

No gallstones or wall thickening visualized. No sonographic Murphy
sign noted.

Common bile duct

Diameter: 6 mm

Liver

Mild hepatic steatosis again noted.  No focal liver mass identified.

IVC

No abnormality visualized.

Pancreas

Visualized portion unremarkable.

Spleen

Size and appearance within normal limits.

Right Kidney

Length: 11.7 cm. Echogenicity within normal limits. No mass or
hydronephrosis visualized.

Left Kidney

Length: 11.8 cm. Echogenicity within normal limits. No mass or
hydronephrosis visualized.

Abdominal aorta

No aneurysm visualized.
IMPRESSION: No evidence of gallstones, biliary dilatation, or other acute
findings.

Stable mild hepatic steatosis.

## 2016-02-23 ENCOUNTER — Ambulatory Visit (INDEPENDENT_AMBULATORY_CARE_PROVIDER_SITE_OTHER): Payer: BLUE CROSS/BLUE SHIELD

## 2016-02-23 ENCOUNTER — Encounter: Payer: Self-pay | Admitting: Podiatry

## 2016-02-23 ENCOUNTER — Ambulatory Visit (INDEPENDENT_AMBULATORY_CARE_PROVIDER_SITE_OTHER): Payer: BLUE CROSS/BLUE SHIELD | Admitting: Podiatry

## 2016-02-23 VITALS — BP 138/92 | HR 88 | Resp 16 | Ht 66.0 in | Wt 270.0 lb

## 2016-02-23 DIAGNOSIS — M79671 Pain in right foot: Secondary | ICD-10-CM | POA: Diagnosis not present

## 2016-02-23 DIAGNOSIS — M722 Plantar fascial fibromatosis: Secondary | ICD-10-CM | POA: Diagnosis not present

## 2016-02-23 MED ORDER — TRIAMCINOLONE ACETONIDE 10 MG/ML IJ SUSP
10.0000 mg | Freq: Once | INTRAMUSCULAR | Status: AC
  Administered 2016-02-23: 10 mg

## 2016-02-23 MED ORDER — DICLOFENAC SODIUM 75 MG PO TBEC: 75.0000 mg | DELAYED_RELEASE_TABLET | Freq: Two times a day (BID) | ORAL | 2 refills | Status: DC

## 2016-02-23 NOTE — Patient Instructions (Signed)

## 2016-02-23 NOTE — Progress Notes (Signed)
   Subjective:    Patient ID: Yolanda Mendez, female    DOB: 14-Apr-1971, 45 y.o.   MRN: [id number]  HPI  Chief Complaint  Patient presents with  . Foot Pain    right heel "rim of heel" x 3 wks.  Pain is worse at night.         Review of Systems  All other systems reviewed and are negative.      Objective:   Physical Exam        Assessment & Plan:

## 2016-02-23 NOTE — Progress Notes (Signed)
Subjective:     Patient ID: Yolanda Mendez, female   DOB: 1970-11-22, 45 y.o.   MRN: [id number]  HPI patient states I've had a lot of pain in my right heel for the last several months and have had a history of this and [place] [employer]   Review of Systems  All other systems reviewed and are negative.      Objective:   Physical Exam  Constitutional: She is oriented to person, place, and time.  Cardiovascular: Intact distal pulses.   Musculoskeletal: Normal range of motion.  Neurological: She is oriented to person, place, and time.  Skin: Skin is warm.  Nursing note and vitals reviewed.  neurovascular status found to be intact muscle strength adequate range of motion within normal limits with patient found to have exquisite discomfort plantar aspect right heel at the insertional point tendon into the calcaneus with fluid buildup around the medial band. Patient's noted have moderate depression of the arch has good digital perfusion and is well oriented 3     Assessment:     Acute plantar fasciitis right with inflammation fluid around the medial band    Plan:     H&P condition reviewed and injected the plantar fascial right 3 mg Kenalog 5 mg Xylocaine and applied fascial brace with instructions on usage. Gave instructions on stretching exercises and anti-inflammatory and reappoint again in 2  X-ray report indicates minimal spur formation with no indications of stress fracture arthritis

## 2016-03-08 ENCOUNTER — Ambulatory Visit (INDEPENDENT_AMBULATORY_CARE_PROVIDER_SITE_OTHER): Payer: BLUE CROSS/BLUE SHIELD | Admitting: Podiatry

## 2016-03-08 ENCOUNTER — Encounter: Payer: Self-pay | Admitting: Podiatry

## 2016-03-08 DIAGNOSIS — M722 Plantar fascial fibromatosis: Secondary | ICD-10-CM | POA: Diagnosis not present

## 2016-03-08 MED ORDER — PREDNISONE 10 MG PO TABS: ORAL_TABLET | ORAL | 0 refills | Status: DC

## 2016-03-08 MED ORDER — TRIAMCINOLONE ACETONIDE 10 MG/ML IJ SUSP
10.0000 mg | Freq: Once | INTRAMUSCULAR | Status: AC
  Administered 2016-03-08: 10 mg

## 2016-03-09 NOTE — Progress Notes (Signed)
Subjective:     Patient ID: Yolanda Mendez, female   DOB: May 03, 1971, 45 y.o.   MRN: [id number]  HPI patient states that her heel is hurting all the time with the right being worse than the left. States that [place] did [place] [employer] but it was painful   Review of Systems     Objective:   Physical Exam Neurovascular status intact muscle strength adequate with inflammation pain around the plantar heel region right with fluid buildup around the medial side and pain when palpated    Assessment:     Plantar fasciitis right inflammation fluid around the medial band    Plan:     Reviewed condition and recommended further injection which was accomplished today 3 mg Kenalog 5 mg Xylocaine and went ahead and dispensed a night splint with all instructions on usage. Continue ice therapy continue shoe gear modifications and reappoint to recheck

## 2016-03-29 ENCOUNTER — Encounter: Payer: Self-pay | Admitting: Podiatry

## 2016-03-29 ENCOUNTER — Ambulatory Visit (INDEPENDENT_AMBULATORY_CARE_PROVIDER_SITE_OTHER): Payer: BLUE CROSS/BLUE SHIELD | Admitting: Podiatry

## 2016-03-29 DIAGNOSIS — M722 Plantar fascial fibromatosis: Secondary | ICD-10-CM

## 2016-03-29 MED ORDER — TRIAMCINOLONE ACETONIDE 10 MG/ML IJ SUSP
10.0000 mg | Freq: Once | INTRAMUSCULAR | Status: AC
  Administered 2016-03-29: 10 mg

## 2016-03-29 NOTE — Progress Notes (Signed)
Subjective:     Patient ID: Yolanda Mendez, female   DOB: May 16, 1971, 45 y.o.   MRN: [id number]  HPI patient states my heel is still very sore and it does not seem to be responding to the splint and it hurts all day when I walk on   Review of Systems     Objective:   Physical Exam Neurovascular status intact muscle strength adequate with patient found to have exquisite discomfort plantar aspect right heel at the insertional point of the tendon into the calcaneus and slightly distal to this point    Assessment:     Plantar fasciitis right distal to the insertion into the calcaneus    Plan:     H&P condition continued to be reviewed reviewed immobilization and dispensed air fracture walker to completely immobilize the foot and lower leg. I then injected the tendon 3 mg Kenalog 5 mg Xylocaine and scanned for orthotics to try to reduce plantar stress. Reappoint to recheck

## 2016-03-31 ENCOUNTER — Telehealth: Payer: Self-pay | Admitting: *Deleted

## 2016-03-31 NOTE — Telephone Encounter (Signed)
Pt states she was suppose to get antiinflammatory. I told pt it was the shot, it had a steroid for inflammation in it.

## 2016-04-26 ENCOUNTER — Ambulatory Visit: Payer: BLUE CROSS/BLUE SHIELD | Admitting: Podiatry

## 2016-05-10 ENCOUNTER — Ambulatory Visit (INDEPENDENT_AMBULATORY_CARE_PROVIDER_SITE_OTHER): Payer: BLUE CROSS/BLUE SHIELD | Admitting: Podiatry

## 2016-05-10 DIAGNOSIS — M722 Plantar fascial fibromatosis: Secondary | ICD-10-CM | POA: Diagnosis not present

## 2016-05-10 NOTE — Patient Instructions (Signed)

## 2016-05-11 NOTE — Progress Notes (Signed)
Subjective:     Patient ID: Yolanda Mendez, female   DOB: 1971-04-23, 45 y.o.   MRN: [id number]  HPI patient states I'm still having pain with mild to moderate improvement with massage therapy   Review of Systems     Objective:   Physical Exam Neurovascular status intact with patient noted to have continued discomfort in the plantar heel right that's improving but still present with palpation    Assessment:     Fasciitis that is hopefully improving with combination of massage therapy night splint and stretching    Plan:     Discussed if symptoms do not get better we may need to consider shockwave therapy and I gave her information on it today and she will continue with massage therapy night splint and supportive shoes with orthotics

## 2018-12-06 ENCOUNTER — Telehealth: Payer: Self-pay

## 2018-12-06 ENCOUNTER — Other Ambulatory Visit: Payer: BLUE CROSS/BLUE SHIELD

## 2018-12-06 DIAGNOSIS — Z20822 Contact with and (suspected) exposure to covid-19: Secondary | ICD-10-CM

## 2018-12-06 NOTE — Telephone Encounter (Signed)
rec'd referral from Dr. Murvin Natal office for COVID testing, due to poss. Exposure.  Called pt. Appt. Scheduled today at [facility] @ 2:30 PM.  Advised to wear a mask and to drive up to the test site and remain in the car.  Verb. Understanding.

## 2018-12-13 LAB — NOVEL CORONAVIRUS, NAA: SARS-CoV-2, NAA: NOT DETECTED

## 2019-04-17 ENCOUNTER — Other Ambulatory Visit: Payer: Self-pay

## 2019-04-17 DIAGNOSIS — Z20822 Contact with and (suspected) exposure to covid-19: Secondary | ICD-10-CM

## 2019-04-18 LAB — NOVEL CORONAVIRUS, NAA: SARS-CoV-2, NAA: NOT DETECTED

## 2019-05-21 ENCOUNTER — Other Ambulatory Visit: Payer: Self-pay

## 2019-05-21 ENCOUNTER — Other Ambulatory Visit: Payer: Self-pay | Admitting: Obstetrics and Gynecology

## 2019-05-21 ENCOUNTER — Encounter: Payer: Self-pay | Admitting: Obstetrics and Gynecology

## 2019-05-21 ENCOUNTER — Ambulatory Visit: Payer: Managed Care, Other (non HMO) | Admitting: Obstetrics and Gynecology

## 2019-05-21 VITALS — BP 152/92 | HR 76 | Temp 97.2°F | Ht 66.0 in | Wt 276.2 lb

## 2019-05-21 DIAGNOSIS — Z01419 Encounter for gynecological examination (general) (routine) without abnormal findings: Secondary | ICD-10-CM

## 2019-05-21 DIAGNOSIS — Z124 Encounter for screening for malignant neoplasm of cervix: Secondary | ICD-10-CM

## 2019-05-21 DIAGNOSIS — Z1231 Encounter for screening mammogram for malignant neoplasm of breast: Secondary | ICD-10-CM

## 2019-05-21 MED ORDER — NORETHINDRONE ACETATE 5 MG PO TABS: ORAL_TABLET | ORAL | 3 refills | Status: DC

## 2019-05-21 NOTE — Patient Instructions (Signed)
EXERCISE AND DIET:  We recommended that you start or continue a regular exercise program for good health. Regular exercise means any activity that makes your heart beat faster and makes you sweat.  We recommend exercising at least 30 minutes per day at least 3 days a week, preferably 4 or 5.  We also recommend a diet low in fat and sugar.  Inactivity, poor dietary choices and obesity can cause diabetes, heart attack, stroke, and kidney damage, among others.    ALCOHOL AND SMOKING:  Women should limit their alcohol intake to no more than 7 drinks/beers/glasses of wine (combined, not each!) per week. Moderation of alcohol intake to this level decreases your risk of breast cancer and liver damage. And of course, no recreational drugs are part of a healthy lifestyle.  And absolutely no smoking or even second hand smoke. Most people know smoking can cause heart and lung diseases, but did you know it also contributes to weakening of your bones? Aging of your skin?  Yellowing of your teeth and nails?  CALCIUM AND VITAMIN D:  Adequate intake of calcium and Vitamin D are recommended.  The recommendations for exact amounts of these supplements seem to change often, but generally speaking 1,000 mg of calcium (between diet and supplement) and 800 units of Vitamin D per day seems prudent. Certain women may benefit from higher intake of Vitamin D.  If you are among these women, your doctor will have told you during your visit.    PAP SMEARS:  Pap smears, to check for cervical cancer or precancers,  have traditionally been done yearly, although recent scientific advances have shown that most women can have pap smears less often.  However, every woman still should have a physical exam from her gynecologist every year. It will include a breast check, inspection of the vulva and vagina to check for abnormal growths or skin changes, a visual exam of the cervix, and then an exam to evaluate the size and shape of the uterus and  ovaries.  And after 47 years of age, a rectal exam is indicated to check for rectal cancers. We will also provide age appropriate advice regarding health maintenance, like when you should have certain vaccines, screening for sexually transmitted diseases, bone density testing, colonoscopy, mammograms, etc.   MAMMOGRAMS:  All women over 40 years old should have a yearly mammogram. Many facilities now offer a "3D" mammogram, which may cost around $50 extra out of pocket. If possible,  we recommend you accept the option to have the 3D mammogram performed.  It both reduces the number of women who will be called back for extra views which then turn out to be normal, and it is better than the routine mammogram at detecting truly abnormal areas.    COLON CANCER SCREENING: Now recommend starting at age 45. At this time colonoscopy is not covered for routine screening until 50. There are take home tests that can be done between 45-49.   COLONOSCOPY:  Colonoscopy to screen for colon cancer is recommended for all women at age 50.  We know, you hate the idea of the prep.  We agree, BUT, having colon cancer and not knowing it is worse!!  Colon cancer so often starts as a polyp that can be seen and removed at colonscopy, which can quite literally save your life!  And if your first colonoscopy is normal and you have no family history of colon cancer, most women don't have to have it again for   10 years.  Once every ten years, you can do something that may end up saving your life, right?  We will be happy to help you get it scheduled when you are ready.  Be sure to check your insurance coverage so you understand how much it will cost.  It may be covered as a preventative service at no cost, but you should check your particular policy.      Breast Self-Awareness Breast self-awareness means being familiar with how your breasts look and feel. It involves checking your breasts regularly and reporting any changes to your  health care provider. Practicing breast self-awareness is important. A change in your breasts can be a sign of a serious medical problem. Being familiar with how your breasts look and feel allows you to find any problems early, when treatment is more likely to be successful. All women should practice breast self-awareness, including women who have had breast implants. How to do a breast self-exam One way to learn what is normal for your breasts and whether your breasts are changing is to do a breast self-exam. To do a breast self-exam: Look for Changes  1. Remove all the clothing above your waist. 2. Stand in front of a mirror in a room with good lighting. 3. Put your hands on your hips. 4. Push your hands firmly downward. 5. Compare your breasts in the mirror. Look for differences between them (asymmetry), such as: ? Differences in shape. ? Differences in size. ? Puckers, dips, and bumps in one breast and not the other. 6. Look at each breast for changes in your skin, such as: ? Redness. ? Scaly areas. 7. Look for changes in your nipples, such as: ? Discharge. ? Bleeding. ? Dimpling. ? Redness. ? A change in position. Feel for Changes Carefully feel your breasts for lumps and changes. It is best to do this while lying on your back on the floor and again while sitting or standing in the shower or tub with soapy water on your skin. Feel each breast in the following way:  Place the arm on the side of the breast you are examining above your head.  Feel your breast with the other hand.  Start in the nipple area and make  inch (2 cm) overlapping circles to feel your breast. Use the pads of your three middle fingers to do this. Apply light pressure, then medium pressure, then firm pressure. The light pressure will allow you to feel the tissue closest to the skin. The medium pressure will allow you to feel the tissue that is a little deeper. The firm pressure will allow you to feel the tissue  close to the ribs.  Continue the overlapping circles, moving downward over the breast until you feel your ribs below your breast.  Move one finger-width toward the center of the body. Continue to use the  inch (2 cm) overlapping circles to feel your breast as you move slowly up toward your collarbone.  Continue the up and down exam using all three pressures until you reach your armpit.  Write Down What You Find  Write down what is normal for each breast and any changes that you find. Keep a written record with breast changes or normal findings for each breast. By writing this information down, you do not need to depend only on memory for size, tenderness, or location. Write down where you are in your menstrual cycle, if you are still menstruating. If you are having trouble noticing differences   in your breasts, do not get discouraged. With time you will become more familiar with the variations in your breasts and more comfortable with the exam. How often should I examine my breasts? Examine your breasts every month. If you are breastfeeding, the best time to examine your breasts is after a feeding or after using a breast pump. If you menstruate, the best time to examine your breasts is 5-7 days after your period is over. During your period, your breasts are lumpier, and it may be more difficult to notice changes. When should I see my health care provider? See your health care provider if you notice:  A change in shape or size of your breasts or nipples.  A change in the skin of your breast or nipples, such as a reddened or scaly area.  Unusual discharge from your nipples.  A lump or thick area that was not there before.  Pain in your breasts.  Anything that concerns you.  

## 2019-05-21 NOTE — Progress Notes (Signed)
48 y.o. [id number] Married White or Caucasian Not Hispanic or Latino female here for annual exam.  She is on daily Aygestin, changed from lo loestrin last year. Prior to the pill she had irregular, heavy bleeding. With the Aygestin, no bleeding.  Sexually active, no pain.   Prior MD encouraged her to get a mirena, she doesn't want an IUD.     No LMP recorded. (Menstrual status: Oral contraceptives).          Sexually active: Yes.    The current method of family planning is vasectomy.    Exercising: No.  The patient does not participate in regular exercise at present. Smoker:  no  Health Maintenance: Pap:  Unsure History of abnormal Pap:  no MMG:  2019 with [facility], WNL per patient BMD:   Never Colonoscopy: Never TDaP:  2019 Gardasil: N/A   reports that she has never smoked. She has never used smokeless tobacco. She reports that she does not drink alcohol or use drugs. She is a travel agent. Son is 57, daughter is 12.5. Son with mild Autism, both kids have ADD.   Past Medical History:  Diagnosis Date  . Anemia   . Asthma   . Cervicitis   . GERD (gastroesophageal reflux disease)   . H. pylori infection   . Migraines   . Obesity   . RLQ abdominal pain     Past Surgical History:  Procedure Laterality Date  . APPENDECTOMY    . DILATION AND CURETTAGE OF UTERUS  10/2005  . HERNIA REPAIR    . LAPAROSCOPY  2001  . UMBILICAL HERNIA REPAIR    Diagnostic laparoscopy: normal.   Current Outpatient Medications  Medication Sig Dispense Refill  . Ascorbic Acid (VITAMIN C PO) Take by mouth.    . budesonide-formoterol (SYMBICORT) 80-4.5 MCG/ACT inhaler Inhale 2 puffs into the lungs 2 (two) times daily.    . diclofenac Sodium (VOLTAREN) 1 % GEL Apply topically 4 (four) times daily.    Marland Kitchen docusate sodium (COLACE) 100 MG capsule Take 100 mg by mouth daily.    Marland Kitchen EPINEPHrine 0.3 mg/0.3 mL IJ SOAJ injection Inject 0.3 mg into the muscle as needed for anaphylaxis.    . famotidine (PEPCID)  40 MG tablet Take 40 mg by mouth daily.    . Ferrous Sulfate (IRON PO) Take by mouth. 65 mg daily    . loratadine (CLARITIN) 10 MG tablet Take 10 mg by mouth daily.    Marland Kitchen LORazepam (ATIVAN) 0.5 MG tablet Take 0.5 mg by mouth every 4 (four) hours as needed for anxiety (anxiety).    Marland Kitchen losartan-hydrochlorothiazide (HYZAAR) 100-25 MG tablet Take 1 tablet by mouth daily.    . Multiple Vitamin (MULTIVITAMIN PO) Take by mouth.    . norethindrone (AYGESTIN) 5 MG tablet Take 2.5 mg by mouth daily.     No current facility-administered medications for this visit.     Family History  Problem Relation Age of Onset  . Cancer Father        brain  . Hypertension Father   . Diabetes Maternal Uncle   . Heart disease Maternal Grandmother   . Stroke Maternal Grandfather   . Diabetes Maternal Grandfather   . Cancer Paternal Grandmother        ovarian  . Hyperlipidemia Paternal Grandmother   . Cancer Paternal Grandfather        prostate  . Diabetes Paternal Grandfather   . Hyperlipidemia Paternal Grandfather   . Hyperlipidemia Mother   .  Hypertension Mother   PGM diagnosed with ovarian cancer at 44.   Review of Systems  Constitutional: Negative.   HENT: Negative.   Eyes: Negative.   Respiratory: Negative.   Cardiovascular: Negative.   Gastrointestinal: Negative.   Endocrine: Negative.   Genitourinary: Negative.   Musculoskeletal: Negative.   Skin: Negative.   Allergic/Immunologic: Negative.   Neurological: Negative.   Hematological: Negative.   Psychiatric/Behavioral: Negative.     Exam:   BP (!) 152/92 (BP Location: Right Arm, Patient Position: Sitting, Cuff Size: Large)   Pulse 76   Temp (!) 97.2 F (36.2 C) (Skin)   Ht 5\' 6"  (1.676 m)   Wt 276 lb 3.2 oz (125.3 kg)   BMI 44.58 kg/m   Weight change: @WEIGHTCHANGE @ Height:   Height: 5\' 6"  (167.6 cm)  Ht Readings from Last 3 Encounters:  05/21/19 5\' 6"  (1.676 m)  02/23/16 5\' 6"  (1.676 m)  04/08/15 5\' 6"  (1.676 m)    General  appearance: alert, cooperative and appears stated age Head: Normocephalic, without obvious abnormality, atraumatic Neck: no adenopathy, supple, symmetrical, trachea midline and thyroid normal to inspection and palpation Lungs: clear to auscultation bilaterally Cardiovascular: regular rate and rhythm Breasts: normal appearance, no masses or tenderness Abdomen: soft, non-tender; non distended,  no masses,  no organomegaly Extremities: extremities normal, atraumatic, no cyanosis or edema Skin: Skin color, texture, turgor normal. No rashes or lesions Lymph nodes: Cervical, supraclavicular, and axillary nodes normal. No abnormal inguinal nodes palpated Neurologic: Grossly normal   Pelvic: External genitalia:  no lesions              Urethra:  normal appearing urethra with no masses, tenderness or lesions              Bartholins and Skenes: normal                 Vagina: normal appearing vagina with normal color and discharge, no lesions              Cervix: no lesions               Bimanual Exam:  Uterus:  normal size, contour, position, consistency, mobility, non-tender and anteverted              Adnexa: no mass, fullness, tenderness               Rectovaginal: Confirms               Anus:  normal sphincter tone, no lesions  Chaperone was present for exam.  A:  Well Woman with normal exam  H/o heavy, irregular cycles, no bleeding with Aygestin  P:   Labs with primary  Pap with hpv  Mammogram due, # given  Continue aygestin  Discussed breast self exam  Discussed calcium and vit D intake

## 2019-05-22 ENCOUNTER — Other Ambulatory Visit (HOSPITAL_COMMUNITY)
Admission: RE | Admit: 2019-05-22 | Discharge: 2019-05-22 | Disposition: A | Discharge status: Home / Self Care | Payer: Managed Care, Other (non HMO) | Source: Ambulatory Visit | Attending: Obstetrics and Gynecology | Admitting: Obstetrics and Gynecology

## 2019-05-22 DIAGNOSIS — Z124 Encounter for screening for malignant neoplasm of cervix: Secondary | ICD-10-CM | POA: Insufficient documentation

## 2019-05-22 NOTE — Addendum Note (Signed)
Addended by: Dorothy Spark on: 05/22/2019 04:33 PM   Modules accepted: Orders

## 2019-05-27 LAB — CYTOLOGY - PAP
Comment: NEGATIVE
Diagnosis: NEGATIVE
Diagnosis: REACTIVE
High risk HPV: NEGATIVE

## 2019-07-01 ENCOUNTER — Ambulatory Visit: Payer: Managed Care, Other (non HMO) | Attending: Internal Medicine

## 2019-07-01 DIAGNOSIS — Z20822 Contact with and (suspected) exposure to covid-19: Secondary | ICD-10-CM

## 2019-07-02 LAB — NOVEL CORONAVIRUS, NAA: SARS-CoV-2, NAA: NOT DETECTED

## 2019-07-10 ENCOUNTER — Ambulatory Visit
Admission: RE | Admit: 2019-07-10 | Discharge: 2019-07-10 | Disposition: A | Discharge status: Home / Self Care | Payer: Managed Care, Other (non HMO) | Source: Ambulatory Visit | Attending: Obstetrics and Gynecology | Admitting: Obstetrics and Gynecology

## 2019-07-10 ENCOUNTER — Other Ambulatory Visit: Payer: Self-pay

## 2019-07-10 DIAGNOSIS — Z1231 Encounter for screening mammogram for malignant neoplasm of breast: Secondary | ICD-10-CM

## 2019-11-27 ENCOUNTER — Telehealth: Payer: Self-pay

## 2019-11-27 NOTE — Telephone Encounter (Signed)
Patient is calling in regards to pain with sex.

## 2019-11-27 NOTE — Telephone Encounter (Signed)
AEX 05/2019-normal exam   Spoke with pt. Pt states having worsening pain with intercourse for last 2 months. Pt states feels sharp pain that "feels like her husband is poking her bladder". Pt denies any vaginal bulge or any UTI sx. Denies vaginal bleeding. Pt states has used lubrication but feels this is not the problem. Pt taking Aygestin as prescribed.  Negative exam with AEX in Dec 2020. Pt advised to have OV with Dr Oscar La for further evaluation. Pt agreeable.  Pt scheduled for 6/25 at 9 am. Pt verbalized understanding of date and time of appt. Advised will review with Dr Oscar La and return call if any additional recommendations. Pt agreeable.   Routing to Dr Oscar La for review. Encounter closed.

## 2019-12-03 ENCOUNTER — Telehealth: Payer: Self-pay

## 2019-12-03 NOTE — Telephone Encounter (Signed)
Patient left message to cancel office visit appointment. Called patient to reschedule, no answer and left a message to return call to reschedule.

## 2019-12-05 ENCOUNTER — Ambulatory Visit: Payer: Managed Care, Other (non HMO) | Admitting: Obstetrics and Gynecology

## 2020-03-02 ENCOUNTER — Other Ambulatory Visit: Payer: Self-pay | Admitting: Obstetrics and Gynecology

## 2020-03-02 DIAGNOSIS — Z1231 Encounter for screening mammogram for malignant neoplasm of breast: Secondary | ICD-10-CM

## 2020-04-15 ENCOUNTER — Emergency Department (HOSPITAL_COMMUNITY): Payer: Managed Care, Other (non HMO)

## 2020-04-15 ENCOUNTER — Other Ambulatory Visit: Payer: Self-pay

## 2020-04-15 ENCOUNTER — Emergency Department (HOSPITAL_COMMUNITY)
Admission: EM | Admit: 2020-04-15 | Discharge: 2020-04-15 | Disposition: A | Discharge status: Home / Self Care | Payer: Managed Care, Other (non HMO) | Attending: Emergency Medicine | Admitting: Emergency Medicine

## 2020-04-15 DIAGNOSIS — R079 Chest pain, unspecified: Secondary | ICD-10-CM | POA: Insufficient documentation

## 2020-04-15 DIAGNOSIS — I10 Essential (primary) hypertension: Secondary | ICD-10-CM | POA: Insufficient documentation

## 2020-04-15 DIAGNOSIS — J45909 Unspecified asthma, uncomplicated: Secondary | ICD-10-CM | POA: Diagnosis not present

## 2020-04-15 DIAGNOSIS — R6884 Jaw pain: Secondary | ICD-10-CM | POA: Insufficient documentation

## 2020-04-15 DIAGNOSIS — Z7951 Long term (current) use of inhaled steroids: Secondary | ICD-10-CM | POA: Insufficient documentation

## 2020-04-15 DIAGNOSIS — Z79899 Other long term (current) drug therapy: Secondary | ICD-10-CM | POA: Diagnosis not present

## 2020-04-15 DIAGNOSIS — M549 Dorsalgia, unspecified: Secondary | ICD-10-CM | POA: Diagnosis not present

## 2020-04-15 LAB — CBC
HCT: 38.9 % (ref 36.0–46.0)
Hemoglobin: 13 g/dL (ref 12.0–15.0)
MCH: 31.5 pg (ref 26.0–34.0)
MCHC: 33.4 g/dL (ref 30.0–36.0)
MCV: 94.2 fL (ref 80.0–100.0)
Platelets: 313 10*3/uL (ref 150–400)
RBC: 4.13 MIL/uL (ref 3.87–5.11)
RDW: 12.6 % (ref 11.5–15.5)
WBC: 7.1 10*3/uL (ref 4.0–10.5)
nRBC: 0 % (ref 0.0–0.2)

## 2020-04-15 LAB — I-STAT BETA HCG BLOOD, ED (MC, WL, AP ONLY): I-stat hCG, quantitative: <5 m[IU]/mL (ref <5)

## 2020-04-15 LAB — BASIC METABOLIC PANEL
Anion gap: 10 (ref 5–15)
BUN: 7 mg/dL (ref 6–20)
CO2: 26 mmol/L (ref 22–32)
Calcium: 9 mg/dL (ref 8.9–10.3)
Chloride: 103 mmol/L (ref 98–111)
Creatinine, Ser: 0.8 mg/dL (ref 0.44–1.00)
GFR, Estimated: >60 mL/min (ref >60)
Glucose, Bld: 114 mg/dL — ABNORMAL HIGH (ref 70–99)
Potassium: 3.6 mmol/L (ref 3.5–5.1)
Sodium: 139 mmol/L (ref 135–145)

## 2020-04-15 LAB — TROPONIN I (HIGH SENSITIVITY)
Troponin I (High Sensitivity): 3 ng/L (ref <18)
Troponin I (High Sensitivity): 2 ng/L (ref <18)

## 2020-04-15 LAB — D-DIMER, QUANTITATIVE: D-Dimer, Quant: 0.5 ug/mL-FEU (ref 0.00–0.50)

## 2020-04-15 MED ORDER — IOHEXOL 350 MG/ML SOLN
100.0000 mL | Freq: Once | INTRAVENOUS | Status: AC | PRN
  Administered 2020-04-15: 74 mL via INTRAVENOUS

## 2020-04-15 MED ORDER — SODIUM CHLORIDE 0.9 % IV BOLUS
500.0000 mL | Freq: Once | INTRAVENOUS | Status: AC
  Administered 2020-04-15: 500 mL via INTRAVENOUS

## 2020-04-15 MED ORDER — ASPIRIN 81 MG PO CHEW
324.0000 mg | CHEWABLE_TABLET | Freq: Once | ORAL | Status: AC
  Administered 2020-04-15: 324 mg via ORAL
  Filled 2020-04-15: qty 4

## 2020-04-15 NOTE — ED Triage Notes (Signed)
Pt with L sided pulling chest pain since last night that began at rest, woke up this morning with L shoulder and L jaw pain. Denies shob, n/v or other associated symptoms.

## 2020-04-15 NOTE — ED Notes (Signed)
Pt to Xray via stretcher in stable condition 

## 2020-04-15 NOTE — ED Provider Notes (Signed)
[facility] EMERGENCY DEPARTMENT Provider Note   CSN: [id number] Arrival date & time: 04/15/20  0746     History Chief Complaint  Patient presents with  . Chest Pain    Yolanda Mendez is a 49 y.o. female.  HPI 49 year old female presents with chest pain.  Originally started last night and felt like someone was pulling a string out of her chest.  It was in the left anterior chest.  Did not think too much about it and went to bed.  Throughout the night she has been having pain and has been having constant pain since she woke up around 6:30 AM.  Now it feels like more like a pressure.  She has noticed some discomfort in her left jaw as well.  No shortness of breath though it feels like she cannot get a full breath.  A little bit of back discomfort.  No current abdominal pain though her groin hurt a little bit last night.  No leg swelling, recently did travel to [place] about a week ago.  No fevers or cough.  Right now the discomfort is about a 3 out of 10.  She took an Ativan for last night in case it was anxiety but otherwise has not taken meds.  No family history of early coronary disease.  She has a history of hypertension and is on cholesterol medicine.   Past Medical History:  Diagnosis Date  . Anemia   . Asthma   . Cervicitis   . GERD (gastroesophageal reflux disease)   . H. pylori infection   . Migraines   . Obesity   . RLQ abdominal pain     Patient Active Problem List   Diagnosis Date Noted  . H. pylori infection   . GERD (gastroesophageal reflux disease)     Past Surgical History:  Procedure Laterality Date  . APPENDECTOMY    . DILATION AND CURETTAGE OF UTERUS  10/2005  . HERNIA REPAIR    . LAPAROSCOPY  2001  . UMBILICAL HERNIA REPAIR       OB History    Gravida  3   Para  2   Term  2   Preterm      AB  1   Living  2     SAB  1   TAB      Ectopic      Multiple      Live Births  2           Family History    Problem Relation Age of Onset  . Cancer Father        brain  . Hypertension Father   . Diabetes Maternal Uncle   . Heart disease Maternal Grandmother   . Stroke Maternal Grandfather   . Diabetes Maternal Grandfather   . Cancer Paternal Grandmother        ovarian  . Hyperlipidemia Paternal Grandmother   . Cancer Paternal Grandfather        prostate  . Diabetes Paternal Grandfather   . Hyperlipidemia Paternal Grandfather   . Hyperlipidemia Mother   . Hypertension Mother     Social History   Tobacco Use  . Smoking status: Never Smoker  . Smokeless tobacco: Never Used  Vaping Use  . Vaping Use: Never used  Substance Use Topics  . Alcohol use: No  . Drug use: No    Home Medications Prior to Admission medications   Medication Sig Start Date End Date Taking? Authorizing  Provider  ascorbic acid (VITAMIN C) 250 MG tablet Take 250 mg by mouth daily.   Yes [provider]  budesonide-formoterol (SYMBICORT) 80-4.5 MCG/ACT inhaler Inhale 2 puffs into the lungs 2 (two) times daily as needed (shortness of breath).    Yes [provider]  docusate sodium (COLACE) 100 MG capsule Take 100 mg by mouth daily.   Yes [provider]  EPINEPHrine 0.3 mg/0.3 mL IJ SOAJ injection Inject 0.3 mg into the muscle as needed for anaphylaxis (call 911).    Yes [provider]  Ferrous Sulfate (IRON PO) Take 65 mg by mouth daily.    Yes [provider]  ibuprofen (ADVIL) 200 MG tablet Take 600 mg by mouth every 6 (six) hours as needed for headache or moderate pain.   Yes [provider]  levothyroxine (SYNTHROID) 50 MCG tablet Take 50 mcg by mouth daily before breakfast.   Yes [provider]  loratadine (CLARITIN) 10 MG tablet Take 10 mg by mouth daily.   Yes [provider]  LORazepam (ATIVAN) 0.5 MG tablet Take 0.5 mg by mouth every 4 (four) hours as needed for anxiety.    Yes [provider]  losartan-hydrochlorothiazide  (HYZAAR) 100-25 MG tablet Take 1 tablet by mouth daily. 03/04/19  Yes [provider]  Multiple Vitamin (MULTIVITAMIN PO) Take 1 tablet by mouth daily.    Yes [provider]  norethindrone (AYGESTIN) 5 MG tablet Take 1/2 tablet a day Patient taking differently: Take 2.5 mg by mouth daily.  05/21/19  Yes Romualdo Bolk, MD  omeprazole (PRILOSEC) 20 MG capsule Take 20 mg by mouth daily.   Yes [provider]  vitamin B-12 (CYANOCOBALAMIN) 1000 MCG tablet Take 1,000 mcg by mouth daily.   Yes [provider]    Allergies    Other, Keflex [cephalexin], Macrolides and ketolides, and Sulfa antibiotics  Review of Systems   Review of Systems  Constitutional: Negative for fever.  Respiratory: Negative for cough and shortness of breath.   Cardiovascular: Positive for chest pain.  Gastrointestinal: Negative for abdominal pain and vomiting.  Musculoskeletal: Positive for back pain.  All other systems reviewed and are negative.   Physical Exam Updated Vital Signs BP 118/70 (BP Location: Left Arm)   Pulse 79   Temp 98.1 F (36.7 C) (Oral)   Resp 15   Ht 5\' 6"  (1.676 m)   Wt 124.7 kg   SpO2 100%   BMI 44.39 kg/m   Physical Exam Vitals and nursing note reviewed.  Constitutional:      Appearance: She is well-developed. She is obese.  HENT:     Head: Normocephalic and atraumatic.     Right Ear: External ear normal.     Left Ear: External ear normal.     Nose: Nose normal.  Eyes:     General:        Right eye: No discharge.        Left eye: No discharge.  Cardiovascular:     Rate and Rhythm: Normal rate and regular rhythm.     Heart sounds: Normal heart sounds.  Pulmonary:     Effort: Pulmonary effort is normal.     Breath sounds: Normal breath sounds.  Chest:     Chest wall: No tenderness.  Abdominal:     Palpations: Abdomen is soft.     Tenderness: There is no abdominal tenderness.  Musculoskeletal:     Right lower leg: No tenderness.  No edema.  Left lower leg: No tenderness. No edema.  Skin:    General: Skin is warm and dry.  Neurological:     Mental Status: She is alert.  Psychiatric:        Mood and Affect: Mood is not anxious.     ED Results / Procedures / Treatments   Labs (all labs ordered are listed, but only abnormal results are displayed) Labs Reviewed  BASIC METABOLIC PANEL - Abnormal; Notable for the following components:      Result Value   Glucose, Bld 114 (*)    All other components within normal limits  CBC  D-DIMER, QUANTITATIVE (NOT AT [facility])  I-STAT BETA HCG BLOOD, ED ([facility], [facility], [facility] ONLY)  TROPONIN I (HIGH SENSITIVITY)  TROPONIN I (HIGH SENSITIVITY)    EKG EKG Interpretation  Date/Time:  Thursday April 15 2020 07:49:53 EDT Ventricular Rate:  99 PR Interval:  158 QRS Duration: 76 QT Interval:  340 QTC Calculation: 436 R Axis:   -15 Text Interpretation: Normal sinus rhythm Low voltage QRS Possible Anterolateral infarct , age undetermined Abnormal ECG nonspecific T waves. Confirmed by Pricilla Loveless [phone]) on 04/15/2020 8:04:40 AM Also confirmed by Pricilla Loveless [phone]), editor Elita Quick [phone])  on 04/15/2020 2:39:26 PM   Radiology DG Chest 2 View  Result Date: 04/15/2020 CLINICAL DATA:  Chest pain.  Chest tightness. EXAM: CHEST - 2 VIEW COMPARISON:  No prior. FINDINGS: Mediastinum hilar structures normal. Heart size normal. Low lung volumes with mild bibasilar atelectasis. No pleural effusion or pneumothorax. No acute bony abnormality identified. IMPRESSION: Low lung volumes with mild bibasilar atelectasis. Electronically Signed   By: Maisie Fus  Register   On: 04/15/2020 08:44   CT Angio Chest PE W and/or Wo Contrast  Result Date: 04/15/2020 CLINICAL DATA:  Left chest pain that began last night. Elevated D-dimer. EXAM: CT ANGIOGRAPHY CHEST WITH CONTRAST TECHNIQUE: Multidetector CT imaging of the chest was performed using the standard protocol during bolus administration of  intravenous contrast. Multiplanar CT image reconstructions and MIPs were obtained to evaluate the vascular anatomy. CONTRAST:  66mL OMNIPAQUE IOHEXOL 350 MG/ML SOLN COMPARISON:  Chest radiographs obtained earlier today. FINDINGS: Cardiovascular: Satisfactory opacification of the pulmonary arteries to the segmental level. No evidence of pulmonary embolism. Normal heart size. No pericardial effusion. Mediastinum/Nodes: No enlarged mediastinal, hilar, or axillary lymph nodes. Thyroid gland, trachea, and esophagus demonstrate no significant findings. Lungs/Pleura: Lungs are clear. No pleural effusion or pneumothorax. Upper Abdomen: Unremarkable. Musculoskeletal: Lower thoracic spine degenerative changes. Review of the MIP images confirms the above findings. IMPRESSION: No pulmonary emboli or acute abnormality. Electronically Signed   By: Beckie Salts M.D.   On: 04/15/2020 12:56    Procedures Procedures (including critical care time)  Medications Ordered in ED Medications  aspirin chewable tablet 324 mg (324 mg Oral Given 04/15/20 0844)  sodium chloride 0.9 % bolus 500 mL (0 mLs Intravenous Stopped 04/15/20 1152)  iohexol (OMNIPAQUE) 350 MG/ML injection 100 mL (74 mLs Intravenous Contrast Given 04/15/20 1240)    ED Course  I have reviewed the triage vital signs and the nursing notes.  Pertinent labs & imaging results that were available during my care of the patient were reviewed by me and considered in my medical decision making (see chart for details).    MDM Rules/Calculators/A&P HEAR Score: 5                        Patient does have some cardiac risk factors but troponin is  negative x2.  Chest pain has improved with aspirin.  We discussed options and she would like to do outpatient follow-up.  Was also worked up for PE given borderline tachycardia upon arrival and borderline D-dimer.  CTA is negative.  Will discharge home with return precautions. Final Clinical Impression(s) / ED Diagnoses Final  diagnoses:  Nonspecific chest pain    Rx / DC Orders ED Discharge Orders    None       Pricilla LovelessGoldston, Shaqueta Casady, MD 04/15/20 [phone]

## 2020-04-15 NOTE — Discharge Instructions (Signed)
If you develop recurrent, continued, or worsening chest pain, shortness of breath, fever, vomiting, abdominal or back pain, or any other new/concerning symptoms then return to the ER for evaluation.  

## 2020-05-03 ENCOUNTER — Other Ambulatory Visit (HOSPITAL_COMMUNITY): Payer: Self-pay | Admitting: Family Medicine

## 2020-05-03 DIAGNOSIS — R0789 Other chest pain: Secondary | ICD-10-CM

## 2020-05-25 NOTE — Progress Notes (Signed)
49 y.o. Yolanda Mendez Married White or Caucasian Not Hispanic or Latino female here for annual exam.   She is on daily long term aygestin to control AUB. No bleeding, no side effects. Vasectomy for contraception. Declined IUD. Switched from OCP's to aygestin by prior MD secondary to HTN.            Sexually active: Yes.    The current method of family planning is vasectomy Exercising: No.  The patient does not participate in regular exercise at present. Smoker:  no  Health Maintenance: Pap:  05/22/19 WNL Hr HPV Neg  History of abnormal Pap:  no MMG:  07/14/19 density B Bi-rads 1 neg  BMD:   None  Colonoscopy: never  TDaP:  2019   Gardasil: none    reports that she has never smoked. She has never used smokeless tobacco. She reports that she does not drink alcohol and does not use drugs. 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 Son with mild Autism, both kids have ADD.   Past Medical History:  Diagnosis Date  . Anemia   . Asthma   . Cervicitis   . GERD (gastroesophageal reflux disease)   . H. pylori infection   . Migraines   . Obesity   . RLQ abdominal pain     Past Surgical History:  Procedure Laterality Date  . APPENDECTOMY    . DILATION AND CURETTAGE OF UTERUS  10/2005  . HERNIA REPAIR    . LAPAROSCOPY  2001  . UMBILICAL HERNIA REPAIR      Current Outpatient Medications  Medication Sig Dispense Refill  . ascorbic acid (VITAMIN C) 250 MG tablet Take 250 mg by mouth daily.    . budesonide-formoterol (SYMBICORT) 80-4.5 MCG/ACT inhaler Inhale 2 puffs into the lungs 2 (two) times daily as needed (shortness of breath).     . docusate sodium (COLACE) 100 MG capsule Take 100 mg by mouth daily.    Marland Kitchen EPINEPHrine 0.3 mg/0.3 mL IJ SOAJ injection Inject 0.3 mg into the muscle as needed for anaphylaxis (call 911).     . Ferrous Sulfate (IRON PO) Take 65 mg by mouth daily.     Marland Kitchen ibuprofen (ADVIL) 200 MG tablet Take 600 mg by mouth every 6 (six) hours as needed for headache or  moderate pain.    Marland Kitchen levothyroxine (SYNTHROID) 50 MCG tablet Take 50 mcg by mouth daily before breakfast.    . loratadine (CLARITIN) 10 MG tablet Take 10 mg by mouth daily.    Marland Kitchen LORazepam (ATIVAN) 0.5 MG tablet Take 0.5 mg by mouth every 4 (four) hours as needed for anxiety.     Marland Kitchen losartan-hydrochlorothiazide (HYZAAR) 100-25 MG tablet Take 1 tablet by mouth daily.    . Multiple Vitamin (MULTIVITAMIN PO) Take 1 tablet by mouth daily.     . norethindrone (AYGESTIN) 5 MG tablet Take 1/2 tablet a day (Patient taking differently: Take 2.5 mg by mouth daily.) 45 tablet 3  . omeprazole (PRILOSEC) 20 MG capsule Take 20 mg by mouth daily.    . vitamin B-12 (CYANOCOBALAMIN) 1000 MCG tablet Take 1,000 mcg by mouth daily.     No current facility-administered medications for this visit.    Family History  Problem Relation Age of Onset  . Cancer Father        brain  . Hypertension Father   . Diabetes Maternal Uncle   . Heart disease Maternal Grandmother   . Stroke Maternal Grandfather   . Diabetes  Maternal Grandfather   . Cancer Paternal Grandmother        ovarian  . Hyperlipidemia Paternal Grandmother   . Cancer Paternal Grandfather        prostate  . Diabetes Paternal Grandfather   . Hyperlipidemia Paternal Grandfather   . Hyperlipidemia Mother   . Hypertension Mother     Review of Systems  All other systems reviewed and are negative.   Exam:   BP 108/76   Pulse 83   Ht 5\' 6"  (1.676 m)   Wt 276 lb (125.2 kg)   SpO2 99%   BMI 44.55 kg/m   Weight change: @WEIGHTCHANGE @ Height:   Height: 5\' 6"  (167.6 cm)  Ht Readings from Last 3 Encounters:  05/26/20 5\' 6"  (1.676 m)  04/15/20 5\' 6"  (1.676 m)  05/21/19 5\' 6"  (1.676 m)    General appearance: alert, cooperative and appears stated age Head: Normocephalic, without obvious abnormality, atraumatic Neck: no adenopathy, supple, symmetrical, trachea midline and thyroid normal to inspection and palpation Lungs: clear to auscultation  bilaterally Cardiovascular: regular rate and rhythm Breasts: normal appearance, no masses or tenderness Abdomen: soft, non-tender; non distended,  no masses,  no organomegaly Extremities: extremities normal, atraumatic, no cyanosis or edema Skin: Skin color, texture, turgor normal. No rashes or lesions Lymph nodes: Cervical, supraclavicular, and axillary nodes normal. No abnormal inguinal nodes palpated Neurologic: Grossly normal   Pelvic: External genitalia:  no lesions              Urethra:  normal appearing urethra with no masses, tenderness or lesions              Bartholins and Skenes: normal                 Vagina: normal appearing vagina with normal color and discharge, no lesions              Cervix: no lesions               Bimanual Exam:  Uterus:  no masses or tenderness               Adnexa: no mass, fullness, tenderness               Rectovaginal: Confirms               Anus:  normal sphincter tone, no lesions  05/28/20 chaperoned for the exam.  A:  Well Woman with normal exam  On Aygestin for AUB, doing well  P:   Continue Aygestin  No pap this year  Mammogram next month  Discussed breast self exam  Discussed calcium and vit D intake  Labs with primary

## 2020-05-26 ENCOUNTER — Other Ambulatory Visit: Payer: Self-pay

## 2020-05-26 ENCOUNTER — Ambulatory Visit: Payer: Managed Care, Other (non HMO) | Admitting: Obstetrics and Gynecology

## 2020-05-26 ENCOUNTER — Encounter: Payer: Self-pay | Admitting: Obstetrics and Gynecology

## 2020-05-26 VITALS — BP 108/76 | HR 83 | Ht 66.0 in | Wt 276.0 lb

## 2020-05-26 DIAGNOSIS — Z01419 Encounter for gynecological examination (general) (routine) without abnormal findings: Secondary | ICD-10-CM

## 2020-05-26 MED ORDER — NORETHINDRONE ACETATE 5 MG PO TABS
ORAL_TABLET | ORAL | 3 refills | Status: DC
Start: 2020-05-26 — End: 2021-06-15

## 2020-05-26 NOTE — Patient Instructions (Signed)

## 2020-07-12 ENCOUNTER — Ambulatory Visit: Payer: Managed Care, Other (non HMO)

## 2020-08-10 ENCOUNTER — Telehealth (HOSPITAL_COMMUNITY): Payer: Self-pay | Admitting: Family Medicine

## 2020-08-10 NOTE — Telephone Encounter (Signed)
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  I called to schedule stress echocardiogram and patient states she spoke with Dr. Duanne Guess last week and they have decided not tohave this test at this time. Order will be removed from the WQ.

## 2020-10-04 ENCOUNTER — Other Ambulatory Visit: Payer: Self-pay

## 2020-10-04 ENCOUNTER — Ambulatory Visit
Admission: RE | Admit: 2020-10-04 | Discharge: 2020-10-04 | Disposition: A | Discharge status: Home / Self Care | Payer: Managed Care, Other (non HMO) | Source: Ambulatory Visit | Attending: Obstetrics and Gynecology | Admitting: Obstetrics and Gynecology

## 2020-10-04 DIAGNOSIS — Z1231 Encounter for screening mammogram for malignant neoplasm of breast: Secondary | ICD-10-CM

## 2020-10-06 ENCOUNTER — Other Ambulatory Visit: Payer: Self-pay | Admitting: Obstetrics and Gynecology

## 2020-10-06 DIAGNOSIS — Z1231 Encounter for screening mammogram for malignant neoplasm of breast: Secondary | ICD-10-CM

## 2020-11-23 ENCOUNTER — Institutional Professional Consult (permissible substitution): Payer: Managed Care, Other (non HMO) | Admitting: Neurology

## 2020-11-29 DIAGNOSIS — Z6841 Body Mass Index (BMI) 40.0 and over, adult: Secondary | ICD-10-CM | POA: Insufficient documentation

## 2020-11-29 DIAGNOSIS — J4530 Mild persistent asthma, uncomplicated: Secondary | ICD-10-CM | POA: Insufficient documentation

## 2020-11-29 DIAGNOSIS — J309 Allergic rhinitis, unspecified: Secondary | ICD-10-CM | POA: Insufficient documentation

## 2021-03-23 ENCOUNTER — Ambulatory Visit: Payer: Managed Care, Other (non HMO) | Admitting: Dermatology

## 2021-03-23 ENCOUNTER — Encounter: Payer: Self-pay | Admitting: Dermatology

## 2021-03-23 ENCOUNTER — Other Ambulatory Visit: Payer: Self-pay

## 2021-03-23 DIAGNOSIS — L814 Other melanin hyperpigmentation: Secondary | ICD-10-CM

## 2021-03-23 DIAGNOSIS — L720 Epidermal cyst: Secondary | ICD-10-CM

## 2021-03-23 DIAGNOSIS — L918 Other hypertrophic disorders of the skin: Secondary | ICD-10-CM

## 2021-03-23 DIAGNOSIS — Z1283 Encounter for screening for malignant neoplasm of skin: Secondary | ICD-10-CM | POA: Diagnosis not present

## 2021-03-23 DIAGNOSIS — B360 Pityriasis versicolor: Secondary | ICD-10-CM

## 2021-03-23 DIAGNOSIS — L738 Other specified follicular disorders: Secondary | ICD-10-CM

## 2021-03-23 MED ORDER — KETOCONAZOLE 2 % EX SHAM: MEDICATED_SHAMPOO | CUTANEOUS | 5 refills | Status: DC

## 2021-03-24 LAB — POCT SKIN KOH: Skin KOH, POC: POSITIVE — AB

## 2021-04-04 DIAGNOSIS — I10 Essential (primary) hypertension: Secondary | ICD-10-CM | POA: Insufficient documentation

## 2021-04-04 DIAGNOSIS — E7800 Pure hypercholesterolemia, unspecified: Secondary | ICD-10-CM | POA: Insufficient documentation

## 2021-04-04 DIAGNOSIS — E039 Hypothyroidism, unspecified: Secondary | ICD-10-CM | POA: Insufficient documentation

## 2021-04-09 ENCOUNTER — Encounter: Payer: Self-pay | Admitting: Dermatology

## 2021-04-09 NOTE — Progress Notes (Signed)
   New Patient   Subjective  Yolanda Mendez is a 50 y.o. female who presents for the following: New Patient (Initial Visit) (Patient here today for skin check. Per patient no personal history or family history of atypical moles, melanoma or non mole skin cancer. Per patient she has a few lesions on her face that she would like checked x years no bleeding, no pain. Patient also states that she has spots that come and go on both arms and chest x years no pain, no bleeding. ).  Check moles, several areas of concern Location:  Duration:  Quality:  Associated Signs/Symptoms: Modifying Factors:  Severity:  Timing: Context:    The following portions of the chart were reviewed this encounter and updated as appropriate:  Tobacco  Allergies  Meds  Problems  Med Hx  Surg Hx  Fam Hx      Objective  Well appearing patient in no apparent distress; mood and affect are within normal limits. Left Buccal Cheek Four millimeter monochrome symmetric light brown macule; no dermoscopic atypia  Head - Anterior (Face) Flesh-colored two millimeter delled papule; typical dermoscopy.  Left Axilla, Right Axilla Fleshy, skin-colored two millimeter  pedunculated papules.    Torso - Posterior (Back) General skin examination: No atypical nevi or signs of NMSC noted at the time of the visit.   Head - Anterior (Face) Half millimeter hard white superficial dermal papules  Left Antecubital Fossa Subtle pink-tan discoloration with branny texture    A full examination was performed including scalp, head, eyes, ears, nose, lips, neck, chest, axillae, abdomen, back, buttocks, bilateral upper extremities, bilateral lower extremities, hands, feet, fingers, toes, fingernails, and toenails. All findings within normal limits unless otherwise noted below.  Areas beneath undergarments not fully examined.   Assessment & Plan  Lentigo Left Buccal Cheek  Recheck if there is change of size, shape, surface or  color  Sebaceous hyperplasia Head - Anterior (Face)  Told of similar appearance of early BCC so if there is growth or bleeding return for biopsy.  Skin tag (2) Left Axilla; Right Axilla  No intervention initiated  Skin exam for malignant neoplasm Torso - Posterior (Back)  Yearly skin check, encouraged to self examine twice annually.  Continue ultraviolet protection.  Milia Head - Anterior (Face)  Benign okay to leave.   Tinea versicolor Left Antecubital Fossa  Get OTC Clotrimazole Cream or lotion, apply daily after bathing for 1 month.  Told that the color may take months to blend.  POCT Skin KOH - Left Antecubital Fossa  ketoconazole (NIZORAL) 2 % shampoo - Left Antecubital Fossa Apply to scalp and let sit 3-5 minutes then rinse.

## 2021-06-12 HISTORY — PX: REDUCTION MAMMAPLASTY: SUR839

## 2021-06-14 NOTE — Progress Notes (Signed)
51 y.o. [id number] Married White or Caucasian Not Hispanic or Latino female here for annual exam.   She is on daily long term aygestin to control AUB. No bleeding, no side effects. Vasectomy for contraception. Declined IUD. Switched from OCP's to aygestin by prior MD secondary to HTN.  She is warm at night, maybe some hot flashes, no night sweats. She is having sleep disturbance and some mood changes but it has been a stressful year.   She is getting a breast reduction later this month.     No LMP recorded. (Menstrual status: Oral contraceptives).          Sexually active: Yes.    The current method of family planning is vasectomy  Exercising: No.  The patient does not participate in regular exercise at present. Smoker:  no  Health Maintenance: Pap:  05/22/19 WNL Hr HPV Neg  History of abnormal Pap:  no MMG:  10/05/20 density B Bi-rads 1 neg  BMD:   none  Colonoscopy: never  TDaP:  2019  Gardasil: none    reports that she has never smoked. She has never used smokeless tobacco. She reports that she does not drink alcohol and does not use drugs. 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 Son with mild Autism, both kids have ADD.  They have 5 dogs and 5 cats.   Past Medical History:  Diagnosis Date   Anemia    Asthma    Cervicitis    GERD (gastroesophageal reflux disease)    H. pylori infection    Migraines    Obesity    RLQ abdominal pain     Past Surgical History:  Procedure Laterality Date   APPENDECTOMY     DILATION AND CURETTAGE OF UTERUS  10/2005   HERNIA REPAIR     LAPAROSCOPY  2001   UMBILICAL HERNIA REPAIR      Current Outpatient Medications  Medication Sig Dispense Refill   docusate sodium (COLACE) 100 MG capsule Take 100 mg by mouth daily.     EPINEPHrine 0.3 mg/0.3 mL IJ SOAJ injection Inject 0.3 mg into the muscle as needed for anaphylaxis (call 911).      Ferrous Sulfate (IRON PO) Take 65 mg by mouth daily.      ibuprofen (ADVIL)  200 MG tablet Take 600 mg by mouth every 6 (six) hours as needed for headache or moderate pain.     levothyroxine (SYNTHROID) 50 MCG tablet Take 50 mcg by mouth daily before breakfast.     loratadine (CLARITIN) 10 MG tablet Take 10 mg by mouth daily.     LORazepam (ATIVAN) 0.5 MG tablet Take 0.5 mg by mouth every 4 (four) hours as needed for anxiety.      losartan-hydrochlorothiazide (HYZAAR) 100-25 MG tablet Take 1 tablet by mouth daily.     Multiple Vitamin (MULTIVITAMIN PO) Take 1 tablet by mouth daily.      norethindrone (AYGESTIN) 5 MG tablet Take 1/2 tablet a day 45 tablet 3   omeprazole (PRILOSEC) 20 MG capsule Take 20 mg by mouth daily.     rosuvastatin (CRESTOR) 5 MG tablet Take 5 mg by mouth at bedtime.     Spacer/Aero-Holding Chambers (AEROCHAMBER PLUS FLO-VU LARGE) MISC See admin instructions.     SYMBICORT 160-4.5 MCG/ACT inhaler SMARTSIG:2 Puff(s) By Mouth Twice Daily     vitamin B-12 (CYANOCOBALAMIN) 1000 MCG tablet Take 1,000 mcg by mouth daily.     No current facility-administered medications  for this visit.    Family History  Problem Relation Age of Onset   Cancer Father        brain   Hypertension Father    Diabetes Maternal Uncle    Heart disease Maternal Grandmother    Stroke Maternal Grandfather    Diabetes Maternal Grandfather    Cancer Paternal Grandmother        ovarian   Hyperlipidemia Paternal Grandmother    Cancer Paternal Grandfather        prostate   Diabetes Paternal Grandfather    Hyperlipidemia Paternal Grandfather    Hyperlipidemia Mother    Hypertension Mother     Review of Systems  All other systems reviewed and are negative.  Exam:   BP 122/74    Pulse 65    Ht 5\' 6"  (1.676 m)    Wt 285 lb (129.3 kg)    SpO2 99%    BMI 46.00 kg/m   Weight change: @WEIGHTCHANGE @ Height:   Height: 5\' 6"  (167.6 cm)  Ht Readings from Last 3 Encounters:  06/15/21 5\' 6"  (1.676 m)  05/26/20 5\' 6"  (1.676 m)  04/15/20 5\' 6"  (1.676 m)    General appearance:  alert, cooperative and appears stated age Head: Normocephalic, without obvious abnormality, atraumatic Neck: no adenopathy, supple, symmetrical, trachea midline and thyroid normal to inspection and palpation Lungs: clear to auscultation bilaterally Cardiovascular: regular rate and rhythm Breasts: normal appearance, no masses or tenderness Abdomen: soft, non-tender; non distended,  no masses,  no organomegaly Extremities: extremities normal, atraumatic, no cyanosis or edema Skin: Skin color, texture, turgor normal. No rashes or lesions Lymph nodes: Cervical, supraclavicular, and axillary nodes normal. No abnormal inguinal nodes palpated Neurologic: Grossly normal   Pelvic: External genitalia:  no lesions              Urethra:  normal appearing urethra with no masses, tenderness or lesions              Bartholins and Skenes: normal                 Vagina: normal appearing vagina with normal color and discharge, no lesions              Cervix: no lesions               Bimanual Exam:  Uterus:  normal size, contour, position, consistency, mobility, non-tender and anteverted              Adnexa: no mass, fullness, tenderness               Rectovaginal: Confirms               Anus:  normal sphincter tone, no lesions  08/13/21 chaperoned for the exam.  1. Well woman exam No pap this year Discussed breast self exam Discussed calcium and vit D intake Labs with primary Mammogram due in 4/23  2. History of abnormal uterine bleeding Controlled with aygestin - norethindrone (AYGESTIN) 5 MG tablet; Take 1/2 tablet a day  Dispense: 45 tablet; Refill: 3  3. Colon cancer screening She will schedule colonoscopy with her primary

## 2021-06-15 ENCOUNTER — Ambulatory Visit (INDEPENDENT_AMBULATORY_CARE_PROVIDER_SITE_OTHER): Payer: Managed Care, Other (non HMO) | Admitting: Obstetrics and Gynecology

## 2021-06-15 ENCOUNTER — Other Ambulatory Visit: Payer: Self-pay

## 2021-06-15 ENCOUNTER — Encounter: Payer: Self-pay | Admitting: Obstetrics and Gynecology

## 2021-06-15 ENCOUNTER — Ambulatory Visit: Payer: Managed Care, Other (non HMO) | Admitting: Obstetrics and Gynecology

## 2021-06-15 VITALS — BP 122/74 | HR 65 | Ht 66.0 in | Wt 285.0 lb

## 2021-06-15 DIAGNOSIS — Z1211 Encounter for screening for malignant neoplasm of colon: Secondary | ICD-10-CM

## 2021-06-15 DIAGNOSIS — Z8742 Personal history of other diseases of the female genital tract: Secondary | ICD-10-CM | POA: Diagnosis not present

## 2021-06-15 DIAGNOSIS — Z01419 Encounter for gynecological examination (general) (routine) without abnormal findings: Secondary | ICD-10-CM | POA: Diagnosis not present

## 2021-06-15 MED ORDER — NORETHINDRONE ACETATE 5 MG PO TABS: ORAL_TABLET | ORAL | 3 refills | Status: DC

## 2021-06-15 NOTE — Patient Instructions (Addendum)
EXERCISE   We recommended that you start or continue a regular exercise program for good health. Physical activity is anything that gets your body moving, some is better than none. The CDC recommends 150 minutes per week of Moderate-Intensity Aerobic Activity and 2 or more days of Muscle Strengthening Activity.  Benefits of exercise are limitless: helps weight loss/weight maintenance, improves mood and energy, helps with depression and anxiety, improves sleep, tones and strengthens muscles, improves balance, improves bone density, protects from chronic conditions such as heart disease, high blood pressure and diabetes and so much more. To learn more visit: https://www.cdc.gov/physicalactivity/index.html  DIET: Good nutrition starts with a healthy diet of fruits, vegetables, whole grains, and lean protein sources. Drink plenty of water for hydration. Minimize empty calories, sodium, sweets. For more information about dietary recommendations visit: https://health.gov/our-work/nutrition-physical-activity/dietary-guidelines and https://www.myplate.gov/  ALCOHOL:  Women should limit their alcohol intake to no more than 7 drinks/beers/glasses of wine (combined, not each!) per week. Moderation of alcohol intake to this level decreases your risk of breast cancer and liver damage.  If you are concerned that you may have a problem, or your friends have told you they are concerned about your drinking, there are many resources to help. A well-known program that is free, effective, and available to all people all over the nation is Alcoholics Anonymous.  Check out this site to learn more: https://www.aa.org/   CALCIUM AND VITAMIN D:  Adequate intake of calcium and Vitamin D are recommended for bone health.  You should be getting between 1000-1200 mg of calcium and 800 units of Vitamin D daily between diet and supplements  PAP SMEARS:  Pap smears, to check for cervical cancer or precancers,  have traditionally been  done yearly, scientific advances have shown that most women can have pap smears less often.  However, every woman still should have a physical exam from her gynecologist every year. It will include a breast check, inspection of the vulva and vagina to check for abnormal growths or skin changes, a visual exam of the cervix, and then an exam to evaluate the size and shape of the uterus and ovaries. We will also provide age appropriate advice regarding health maintenance, like when you should have certain vaccines, screening for sexually transmitted diseases, bone density testing, colonoscopy, mammograms, etc.   MAMMOGRAMS:  All women over 40 years old should have a routine mammogram.   COLON CANCER SCREENING: Now recommend starting at age 45. At this time colonoscopy is not covered for routine screening until 50. There are take home tests that can be done between 45-49.   COLONOSCOPY:  Colonoscopy to screen for colon cancer is recommended for all women at age 50.  We know, you hate the idea of the prep.  We agree, BUT, having colon cancer and not knowing it is worse!!  Colon cancer so often starts as a polyp that can be seen and removed at colonscopy, which can quite literally save your life!  And if your first colonoscopy is normal and you have no family history of colon cancer, most women don't have to have it again for 10 years.  Once every ten years, you can do something that may end up saving your life, right?  We will be happy to help you get it scheduled when you are ready.  Be sure to check your insurance coverage so you understand how much it will cost.  It may be covered as a preventative service at no cost, but you should check   your particular policy.      Breast Self-Awareness Breast self-awareness means being familiar with how your breasts look and feel. It involves checking your breasts regularly and reporting any changes to your health care provider. Practicing breast self-awareness is  important. A change in your breasts can be a sign of a serious medical problem. Being familiar with how your breasts look and feel allows you to find any problems early, when treatment is more likely to be successful. All women should practice breast self-awareness, including women who have had breast implants. How to do a breast self-exam One way to learn what is normal for your breasts and whether your breasts are changing is to do a breast self-exam. To do a breast self-exam: Look for Changes  Remove all the clothing above your waist. Stand in front of a mirror in a room with good lighting. Put your hands on your hips. Push your hands firmly downward. Compare your breasts in the mirror. Look for differences between them (asymmetry), such as: Differences in shape. Differences in size. Puckers, dips, and bumps in one breast and not the other. Look at each breast for changes in your skin, such as: Redness. Scaly areas. Look for changes in your nipples, such as: Discharge. Bleeding. Dimpling. Redness. A change in position. Feel for Changes Carefully feel your breasts for lumps and changes. It is best to do this while lying on your back on the floor and again while sitting or standing in the shower or tub with soapy water on your skin. Feel each breast in the following way: Place the arm on the side of the breast you are examining above your head. Feel your breast with the other hand. Start in the nipple area and make  inch (2 cm) overlapping circles to feel your breast. Use the pads of your three middle fingers to do this. Apply light pressure, then medium pressure, then firm pressure. The light pressure will allow you to feel the tissue closest to the skin. The medium pressure will allow you to feel the tissue that is a little deeper. The firm pressure will allow you to feel the tissue close to the ribs. Continue the overlapping circles, moving downward over the breast until you feel your  ribs below your breast. Move one finger-width toward the center of the body. Continue to use the  inch (2 cm) overlapping circles to feel your breast as you move slowly up toward your collarbone. Continue the up and down exam using all three pressures until you reach your armpit.  Write Down What You Find  Write down what is normal for each breast and any changes that you find. Keep a written record with breast changes or normal findings for each breast. By writing this information down, you do not need to depend only on memory for size, tenderness, or location. Write down where you are in your menstrual cycle, if you are still menstruating. If you are having trouble noticing differences in your breasts, do not get discouraged. With time you will become more familiar with the variations in your breasts and more comfortable with the exam. How often should I examine my breasts? Examine your breasts every month. If you are breastfeeding, the best time to examine your breasts is after a feeding or after using a breast pump. If you menstruate, the best time to examine your breasts is 5-7 days after your period is over. During your period, your breasts are lumpier, and it may be more   difficult to notice changes. When should I see my health care provider? See your health care provider if you notice: A change in shape or size of your breasts or nipples. A change in the skin of your breast or nipples, such as a reddened or scaly area. Unusual discharge from your nipples. A lump or thick area that was not there before. Pain in your breasts. Anything that concerns you. Menopause Menopause is the normal time of a woman's life when menstrual periods stop completely. It marks the natural end to a woman's ability to become pregnant. It can be defined as the absence of a menstrual period for 12 months without another medical cause. The transition to menopause (perimenopause) most often happens between the ages  of 45 and 55, and can last for many years. During perimenopause, hormone levels change in your body, which can cause symptoms and affect your health. Menopause may increase your risk for: Weakened bones (osteoporosis), which causes fractures. Depression. Hardening and narrowing of the arteries (atherosclerosis), which can cause heart attacks and strokes. What are the causes? This condition is usually caused by a natural change in hormone levels that happens as you get older. The condition may also be caused by changes that are not natural, including: Surgery to remove both ovaries (surgical menopause). Side effects from some medicines, such as chemotherapy used to treat cancer (chemical menopause). What increases the risk? This condition is more likely to start at an earlier age if you have certain medical conditions or have undergone treatments, including: A tumor of the pituitary gland in the brain. A disease that affects the ovaries and hormones. Certain cancer treatments, such as chemotherapy or hormone therapy, or radiation therapy on the pelvis. Heavy smoking and excessive alcohol use. Family history of early menopause. This condition is also more likely to develop earlier in women who are very thin. What are the signs or symptoms? Symptoms of this condition include: Hot flashes. Irregular menstrual periods. Night sweats. Changes in feelings about sex. This could be a decrease in sex drive or an increased discomfort around your sexuality. Vaginal dryness and thinning of the vaginal walls. This may cause painful sex. Dryness of the skin and development of wrinkles. Headaches. Problems sleeping (insomnia). Mood swings or irritability. Memory problems. Weight gain. Hair growth on the face and chest. Bladder infections or problems with urinating. How is this diagnosed? This condition is diagnosed based on your medical history, a physical exam, your age, your menstrual history, and  your symptoms. Hormone tests may also be done. How is this treated? In some cases, no treatment is needed. You and your health care provider should make a decision together about whether treatment is necessary. Treatment will be based on your individual condition and preferences. Treatment for this condition focuses on managing symptoms. Treatment may include: Menopausal hormone therapy (MHT). Medicines to treat specific symptoms or complications. Acupuncture. Vitamin or herbal supplements. Before starting treatment, make sure to let your health care provider know if you have a personal or family history of these conditions: Heart disease. Breast cancer. Blood clots. Diabetes. Osteoporosis. Follow these instructions at home: Lifestyle Do not use any products that contain nicotine or tobacco, such as cigarettes, e-cigarettes, and chewing tobacco. If you need help quitting, ask your health care provider. Get at least 30 minutes of physical activity on 5 or more days each week. Avoid alcoholic and caffeinated beverages, as well as spicy foods. This may help prevent hot flashes. Get 7-8 hours of sleep   each night. If you have hot flashes, try: Dressing in layers. Avoiding things that may trigger hot flashes, such as spicy food, warm places, or stress. Taking slow, deep breaths when a hot flash starts. Keeping a fan in your home and office. Find ways to manage stress, such as deep breathing, meditation, or journaling. Consider going to group therapy with other women who are having menopause symptoms. Ask your health care provider about recommended group therapy meetings. Eating and drinking  Eat a healthy, balanced diet that contains whole grains, lean protein, low-fat dairy, and plenty of fruits and vegetables. Your health care provider may recommend adding more soy to your diet. Foods that contain soy include tofu, tempeh, and soy milk. Eat plenty of foods that contain calcium and vitamin D  for bone health. Items that are rich in calcium include low-fat milk, yogurt, beans, almonds, sardines, broccoli, and kale. Medicines Take over-the-counter and prescription medicines only as told by your health care provider. Talk with your health care provider before starting any herbal supplements. If prescribed, take vitamins and supplements as told by your health care provider. General instructions  Keep track of your menstrual periods, including: When they occur. How heavy they are and how long they last. How much time passes between periods. Keep track of your symptoms, noting when they start, how often you have them, and how long they last. Use vaginal lubricants or moisturizers to help with vaginal dryness and improve comfort during sex. Keep all follow-up visits. This is important. This includes any group therapy or counseling. Contact a health care provider if: You are still having menstrual periods after age 55. You have pain during sex. You have not had a period for 12 months and you develop vaginal bleeding. Get help right away if you have: Severe depression. Excessive vaginal bleeding. Pain when you urinate. A fast or irregular heartbeat (palpitations). Severe headaches. Abdominal pain or severe indigestion. Summary Menopause is a normal time of life when menstrual periods stop completely. It is usually defined as the absence of a menstrual period for 12 months without another medical cause. The transition to menopause (perimenopause) most often happens between the ages of 45 and 55 and can last for several years. Symptoms can be managed through medicines, lifestyle changes, and complementary therapies such as acupuncture. Eat a balanced diet that is rich in nutrients to promote bone health and heart health and to manage symptoms during menopause. This information is not intended to replace advice given to you by your health care provider. Make sure you discuss any  questions you have with your health care provider. Document Revised: 02/27/2020 Document Reviewed: 11/13/2019 Elsevier Patient Education  2022 [employer]  

## 2021-07-05 ENCOUNTER — Other Ambulatory Visit: Payer: Self-pay

## 2021-08-03 ENCOUNTER — Other Ambulatory Visit: Payer: Self-pay | Admitting: Obstetrics and Gynecology

## 2021-08-03 DIAGNOSIS — Z1231 Encounter for screening mammogram for malignant neoplasm of breast: Secondary | ICD-10-CM

## 2021-10-05 ENCOUNTER — Ambulatory Visit: Payer: Managed Care, Other (non HMO)

## 2021-12-05 ENCOUNTER — Ambulatory Visit (INDEPENDENT_AMBULATORY_CARE_PROVIDER_SITE_OTHER): Payer: Managed Care, Other (non HMO) | Admitting: Family Medicine

## 2021-12-05 DIAGNOSIS — E119 Type 2 diabetes mellitus without complications: Secondary | ICD-10-CM

## 2022-02-14 ENCOUNTER — Ambulatory Visit
Admission: RE | Admit: 2022-02-14 | Discharge: 2022-02-14 | Disposition: A | Discharge status: Home / Self Care | Payer: Managed Care, Other (non HMO) | Source: Ambulatory Visit | Attending: Obstetrics and Gynecology | Admitting: Obstetrics and Gynecology

## 2022-02-14 DIAGNOSIS — Z1231 Encounter for screening mammogram for malignant neoplasm of breast: Secondary | ICD-10-CM

## 2022-02-20 ENCOUNTER — Ambulatory Visit (INDEPENDENT_AMBULATORY_CARE_PROVIDER_SITE_OTHER): Payer: Managed Care, Other (non HMO) | Admitting: Family Medicine

## 2022-02-20 DIAGNOSIS — E119 Type 2 diabetes mellitus without complications: Secondary | ICD-10-CM | POA: Diagnosis not present

## 2022-02-20 NOTE — Patient Instructions (Signed)
-   Obtain most carb's earlier in the day (vs at bedtime).  Consider adding a carb source to breakfast.  Your protein bar has 15 grams of carbohydrate.  You can add another 15 g of carb with most servings of fruit.  Pears, berries, peaches, apples are all low-glycemic.   - Complete Meal Planning form provided today.    Goals today:  Get to bed no later than 8:30 PM on at least 3 nights a week.         Document bedtime every night.   2.   No checking phone during the night.   3.   Include a source of protein with lunch.  Previous goals:  Eat at least 3 REAL meals and 1-3 snacks per day.  Eat breakfast within one hour of getting up.  Have something to eat at least every 5 hours while awake.  Limit starchy foods to ONE to TWO portions per meal and ONE per snack. Include twice the volume of vegetables as protein or carbohydrate foods in at least 10 meals per week.  Follow-up appt on Tuesday, October 24 at 9 AM.

## 2022-02-20 NOTE — Progress Notes (Signed)
Medical Nutrition Therapy Appt start time: 1000 end time: 1100 (1 hour) Primary concerns today: Weight management and Blood sugar control.   Relevant history/background: Yolanda Mendez was referred by Yolanda Manis Dewey,MD for MNT related to DM (diagnosed on April 2023) and obesity (On  12/05/21: Wt 268, ht 66").   Also has dx of HTN.  After her dx of DM, Yolanda Mendez cut out virtually all carb's.  She recognizes she tends toward all-or-none behaviors.  She identifies time constraints as a barrier to weight loss.  She has been using CGM, and continues to get numerous hypoglycemic on alarms on some days.  Started Ozempic at same time as using CGM.  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  She lives with her husband, 21-YO son (who has autism), and 15-YO daughter.    Assessment:  Yolanda Mendez has been making a strong effort to eat in a more balanced way, although lunch is still difficult.  Another challenge in food prep is the narrow food preferences of her 15-YO daughter.  Yolanda Mendez accessed some of the food and nutrition websites recommended at last visit, but will take another look at them (with her daughter) as she tries to design some specific meals.  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  Now usually only getting up once at night since decreasing water intake to 64 oz/day.    FBG: Uses CGM to monitor BG.  Alarms set for <60 and >240.  Average time in range = 100% except for some low alarms.  FBG are 81-88; avg BG is 99.   Weight: 260.0 lb (self-report) ; ht is 66".    Usual eating pattern: 3 meals and 2 snacks per day. Usual physical activity: Walks 30(+) minutes 7 X wk plus 90-min walk on Tuesdays.   Sleep: ~4 hrs + 1-2 hrs/night.    24-hr recall:  (Up at 6 AM) B (7 AM)-  20 oz coffee, flavored creamer, 1 Special K protein bar (12 g pro, 180 kcal) Snk (10 AM)-  20  oz coffee, flavored creamer, water L (2 PM)-  2 pkts instant grits, 1/4 c sk milk Snk ( PM)-  --- D (6:30 PM)-  3/4 c pasta, 10 shrimp, some spinach and parmesan, 1 c caesar salad, drsng, 12 oz Starry soda Snk ( PM)-  --- Typical day? No.  Usually snacks on nuts, berries, or cheese in afternoon.    Nutritional Diagnosis: Progress noted on NI-1.4 Inadequate energy intake As related to severe carb restriction.  As evidenced by addition of carb to meals several times a week.    Handouts given during visit include: After-Visit Summary (AVS)  Demonstrated degree of understanding via:  Teach Back  Barriers to learning/adherence to lifestyle change: Time constraints and all-or-none (good foods/bad foods) mindset.    Monitoring/Evaluation:  Dietary intake, exercise, BG levels (CGM), and body weight in 6 week(s).

## 2022-03-27 ENCOUNTER — Ambulatory Visit: Payer: Managed Care, Other (non HMO) | Admitting: Dermatology

## 2022-04-03 NOTE — Progress Notes (Deleted)
Medical Nutrition Therapy Appt start time: 1000 end time: 1100 (1 hour) Primary concerns today: Weight management and Blood sugar control.   Relevant history/background: Yolanda Mendez was referred by Yolanda Mola Dewey,Yolanda Mendez for MNT related to DM (diagnosed on April 2023) and obesity (On  12/05/21: Wt 268, ht 66").   Also has dx of HTN.  After her dx of DM, Yolanda Mendez cut out virtually all carb's.  She recognizes she tends toward all-or-none behaviors.  She identifies time constraints as a barrier to weight loss.  She has been using CGM, and continues to get numerous hypoglycemic on alarms on some days.  Started Ozempic at same time as using CGM.  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  She lives with her husband, 21-YO son (who has autism), and 15-YO daughter.    Assessment:  Yolanda Mendez has ***.  FBG: FBG are ***81-88; avg BG is 99.  CGM alarms set for <60 and >240.  Average time in range = ***100% except for some low alarms.   Weight: *** lb (self-reported wt 260.0 lb on 9/11).  Ht 66".    Usual eating pattern: 3 meals and 2 snacks per day. Usual physical activity: Walks ***30(+) minutes 7 X wk plus 90-min walk on Tuesdays.   Sleep: ~4 hrs + 1-2 hrs/night.   Progress on goals:  Bedtime<8:30  3 X wk: *** No phone, nighttime: *** Protein at lunch: ***  24-hr recall suggests intake of *** kcal:  (Up at  AM) B ( AM)-   Snk ( AM)-   L ( PM)-   Snk ( PM)-   D ( PM)-   Snk ( PM)-   Typical day? {yes [id number]  Nutritional Diagnosis: ***Progress noted on NI-1.4 Inadequate energy intake As related to severe carb restriction.  As evidenced by addition of carb to meals several times a week.    Handouts given during visit include: After-Visit Summary (AVS)  Barriers to learning/adherence to lifestyle change: Time constraints and all-or-none (good foods/bad foods) mindset.    Monitoring/Evaluation:  Dietary intake, exercise, BG levels (CGM), and body weight ***in  6 week(s).  From 9/11 *** - Obtain most carb's earlier in the day (vs at bedtime).  Consider adding a carb source to breakfast.  Your protein bar has 15 grams of carbohydrate.  You can add another 15 g of carb with most servings of fruit.  Pears, berries, peaches, apples are all low-glycemic.   - Complete Meal Planning form provided today.     Previous Goals:  Get to bed no later than 8:30 PM on at least 3 nights a week.         Document bedtime every night.   2.   No checking phone during the night.   3.   Include a source of protein with lunch.   Previous goals:  Eat at least 3 REAL meals and 1-3 snacks per day.  Eat breakfast within one hour of getting up.  Have something to eat at least every 5 hours while awake.  Limit starchy foods to ONE to TWO portions per meal and ONE per snack. Include twice the volume of vegetables as protein or carbohydrate foods in at least 10 meals per week.   Follow-up appt on ***.

## 2022-04-04 ENCOUNTER — Ambulatory Visit: Payer: Managed Care, Other (non HMO) | Admitting: Family Medicine

## 2022-06-15 ENCOUNTER — Other Ambulatory Visit: Payer: Self-pay | Admitting: Obstetrics and Gynecology

## 2022-06-15 DIAGNOSIS — Z8742 Personal history of other diseases of the female genital tract: Secondary | ICD-10-CM

## 2022-06-15 NOTE — Telephone Encounter (Signed)
Electronic refill request received for Aygestin 5mg .  AEX is scheduled 08/08/22.  P 05/22/19.  M 02/14/22.  RF sent thru appointment date.

## 2022-07-26 NOTE — Progress Notes (Deleted)
52 y.o. [id number] Married White or Caucasian Not Hispanic or Latino female here for annual exam.      No LMP recorded. (Menstrual status: Oral contraceptives).          Sexually active: {yes no:314532}  The current method of family planning is vasectomy.    Exercising: {yes no:314532}  {types:19826} Smoker:  no  Health Maintenance: Pap:   05/22/19 WNL Hr HPV Neg  History of abnormal Pap:  no MMG:  02/14/2022 density A bi-rads 1 neg  BMD:   none  Colonoscopy: never  TDaP:  2019 Gardasil: n/a   reports that she has never smoked. She has never used smokeless tobacco. She reports that she does not drink alcohol and does not use drugs.  Past Medical History:  Diagnosis Date   Anemia    Asthma    Cervicitis    GERD (gastroesophageal reflux disease)    H. pylori infection    Migraines    Obesity    RLQ abdominal pain     Past Surgical History:  Procedure Laterality Date   APPENDECTOMY     DILATION AND CURETTAGE OF UTERUS  10/10/2005   HERNIA REPAIR     LAPAROSCOPY  06/13/1999   REDUCTION MAMMAPLASTY  123456   UMBILICAL HERNIA REPAIR      Current Outpatient Medications  Medication Sig Dispense Refill   albuterol (VENTOLIN HFA) 108 (90 Base) MCG/ACT inhaler Inhale into the lungs every 6 (six) hours as needed for wheezing or shortness of breath.     Cholecalciferol (VITAMIN D3) 1.25 MG (50000 UT) TABS Take 2,000 Units by mouth daily.     docusate sodium (COLACE) 100 MG capsule Take 100 mg by mouth daily. (Patient not taking: Reported on 12/05/2021)     EPINEPHrine 0.3 mg/0.3 mL IJ SOAJ injection Inject 0.3 mg into the muscle as needed for anaphylaxis (call 911).      Ferrous Sulfate (IRON PO) Take 65 mg by mouth once a week.     ibuprofen (ADVIL) 200 MG tablet Take 600 mg by mouth every 6 (six) hours as needed for headache or moderate pain.     levothyroxine (SYNTHROID) 50 MCG tablet Take 50 mcg by mouth daily before breakfast.     loratadine (CLARITIN) 10 MG tablet Take 10 mg by  mouth daily.     LORazepam (ATIVAN) 0.5 MG tablet Take 0.5 mg by mouth every 4 (four) hours as needed for anxiety.      losartan-hydrochlorothiazide (HYZAAR) 100-25 MG tablet Take 1 tablet by mouth daily.     Multiple Vitamin (MULTIVITAMIN PO) Take 1 tablet by mouth daily.  (Patient not taking: Reported on 12/05/2021)     norethindrone (AYGESTIN) 5 MG tablet TAKE 1/2 TABLET BY MOUTH EVERY DAY 45 tablet 0   omeprazole (PRILOSEC) 20 MG capsule Take 20 mg by mouth daily.     rosuvastatin (CRESTOR) 5 MG tablet Take 5 mg by mouth at bedtime.     Semaglutide,0.25 or 0.5MG/DOS, (OZEMPIC, 0.25 OR 0.5 MG/DOSE,) 2 MG/3ML SOPN Inject into the skin.     Spacer/Aero-Holding Chambers (AEROCHAMBER PLUS FLO-VU LARGE) MISC See admin instructions.     SYMBICORT 160-4.5 MCG/ACT inhaler SMARTSIG:2 Puff(s) By Mouth Twice Daily     vitamin B-12 (CYANOCOBALAMIN) 1000 MCG tablet Take 1,000 mcg by mouth daily.     No current facility-administered medications for this visit.    Family History  Problem Relation Age of Onset   Cancer Father  brain   Hypertension Father    Diabetes Maternal Uncle    Heart disease Maternal Grandmother    Stroke Maternal Grandfather    Diabetes Maternal Grandfather    Cancer Paternal Grandmother        ovarian   Hyperlipidemia Paternal Grandmother    Cancer Paternal Grandfather        prostate   Diabetes Paternal Grandfather    Hyperlipidemia Paternal Grandfather    Hyperlipidemia Mother    Hypertension Mother     Review of Systems  Exam:   There were no vitals taken for this visit.  Weight change: @WEIGHTCHANGE$ @ Height:      Ht Readings from Last 3 Encounters:  02/20/22 5' 6"$  (1.676 m)  12/05/21 5' 6"$  (1.676 m)  06/15/21 5' 6"$  (1.676 m)    General appearance: alert, cooperative and appears stated age Head: Normocephalic, without obvious abnormality, atraumatic Neck: no adenopathy, supple, symmetrical, trachea midline and thyroid {CHL AMB PHY EX THYROID NORM  DEFAULT:[phone]::"normal to inspection and palpation"} Lungs: clear to auscultation bilaterally Cardiovascular: regular rate and rhythm Breasts: {Exam; breast:13139::"normal appearance, no masses or tenderness"} Abdomen: soft, non-tender; non distended,  no masses,  no organomegaly Extremities: extremities normal, atraumatic, no cyanosis or edema Skin: Skin color, texture, turgor normal. No rashes or lesions Lymph nodes: Cervical, supraclavicular, and axillary nodes normal. No abnormal inguinal nodes palpated Neurologic: Grossly normal   Pelvic: External genitalia:  no lesions              Urethra:  normal appearing urethra with no masses, tenderness or lesions              Bartholins and Skenes: normal                 Vagina: normal appearing vagina with normal color and discharge, no lesions              Cervix: {CHL AMB PHY EX CERVIX NORM [phone]::"no lesions"}               Bimanual Exam:  Uterus:  {CHL AMB PHY EX UTERUS NORM [phone]::"normal size, contour, position, consistency, mobility, non-tender"}              Adnexa: {CHL AMB PHY EX ADNEXA NO MASS DEFAULT:[phone]::"no mass, fullness, tenderness"}               Rectovaginal: Confirms               Anus:  normal sphincter tone, no lesions  *** chaperoned for the exam.  A:  Well Woman with normal exam  P:

## 2022-08-08 ENCOUNTER — Ambulatory Visit: Payer: Managed Care, Other (non HMO) | Admitting: Obstetrics and Gynecology

## 2022-09-14 ENCOUNTER — Encounter: Payer: Managed Care, Other (non HMO) | Admitting: Nurse Practitioner

## 2022-09-17 ENCOUNTER — Other Ambulatory Visit: Payer: Self-pay | Admitting: Obstetrics and Gynecology

## 2022-09-17 DIAGNOSIS — Z8742 Personal history of other diseases of the female genital tract: Secondary | ICD-10-CM

## 2022-09-17 IMAGING — MG MM DIGITAL SCREENING BILAT W/ TOMO AND CAD
6 of 10 series · 6 of 30 positions shown · non-contrast
Comparison: Previous exam(s).

CLINICAL DATA: Screening.

EXAM:
DIGITAL SCREENING BILATERAL MAMMOGRAM WITH TOMOSYNTHESIS AND CAD
TECHNIQUE: Bilateral screening digital craniocaudal and mediolateral oblique
mammograms were obtained. Bilateral screening digital breast
tomosynthesis was performed. The images were evaluated with
computer-aided detection.

[R MLO synth-2D]
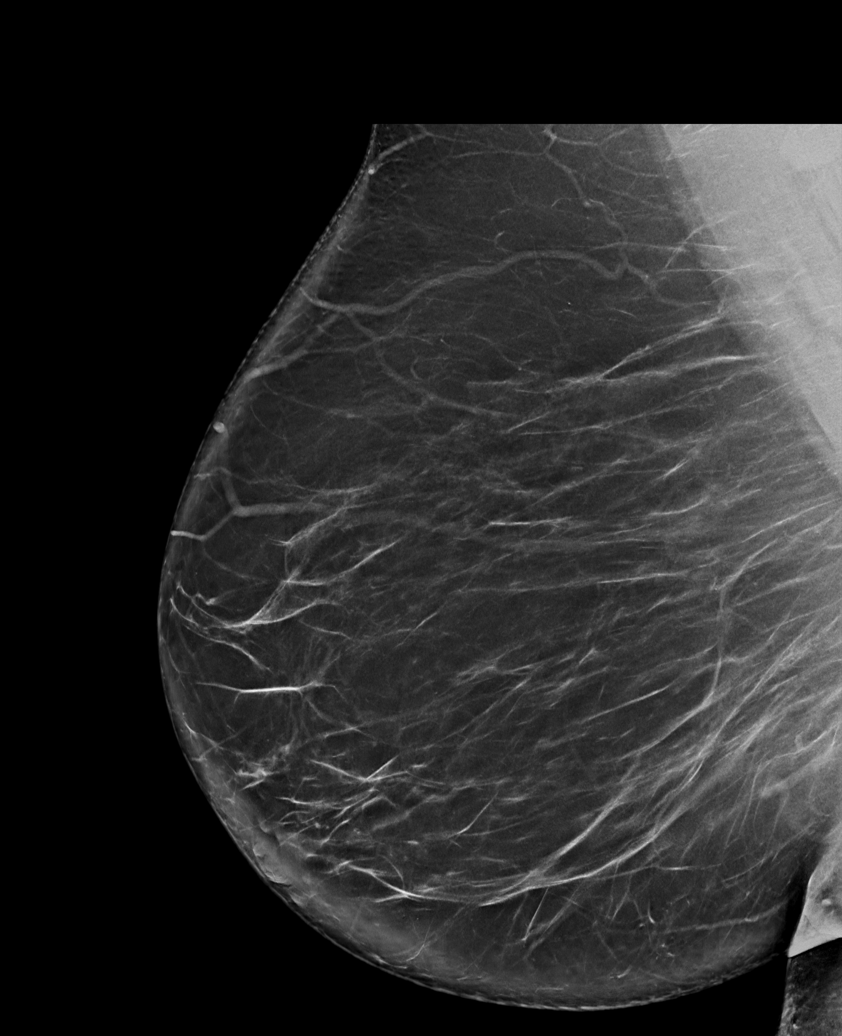

[R CC synth-2D]
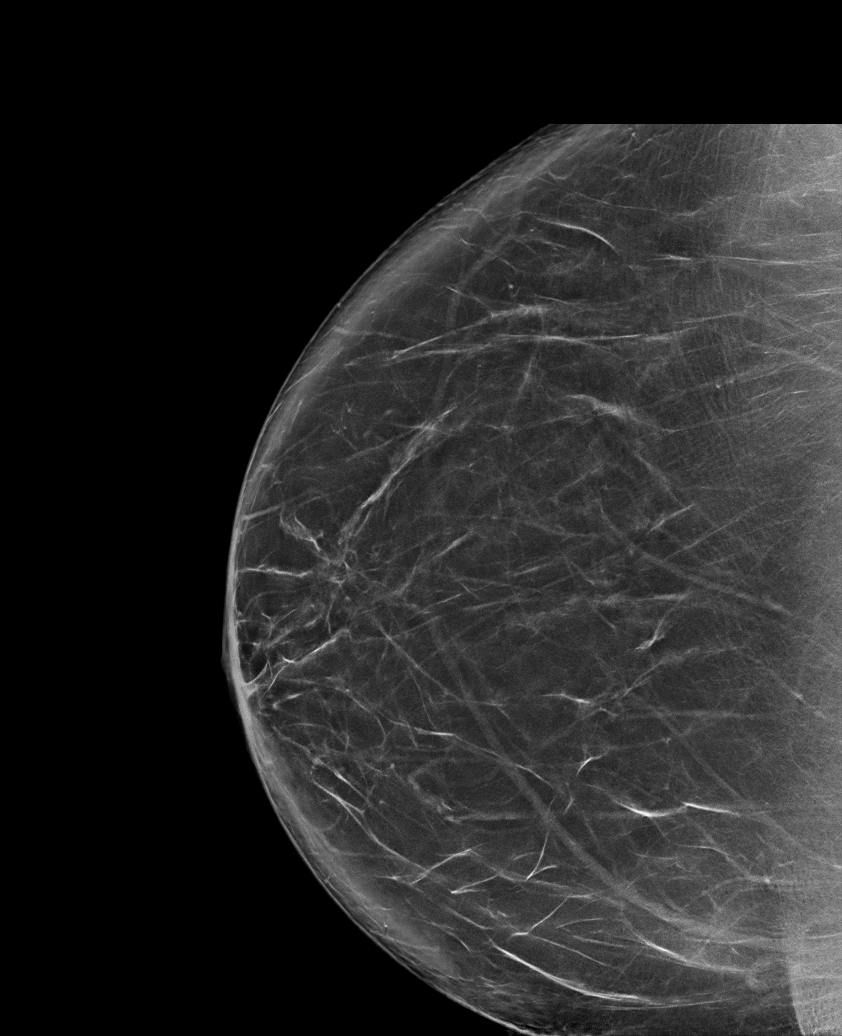

[L MLO synth-2D]
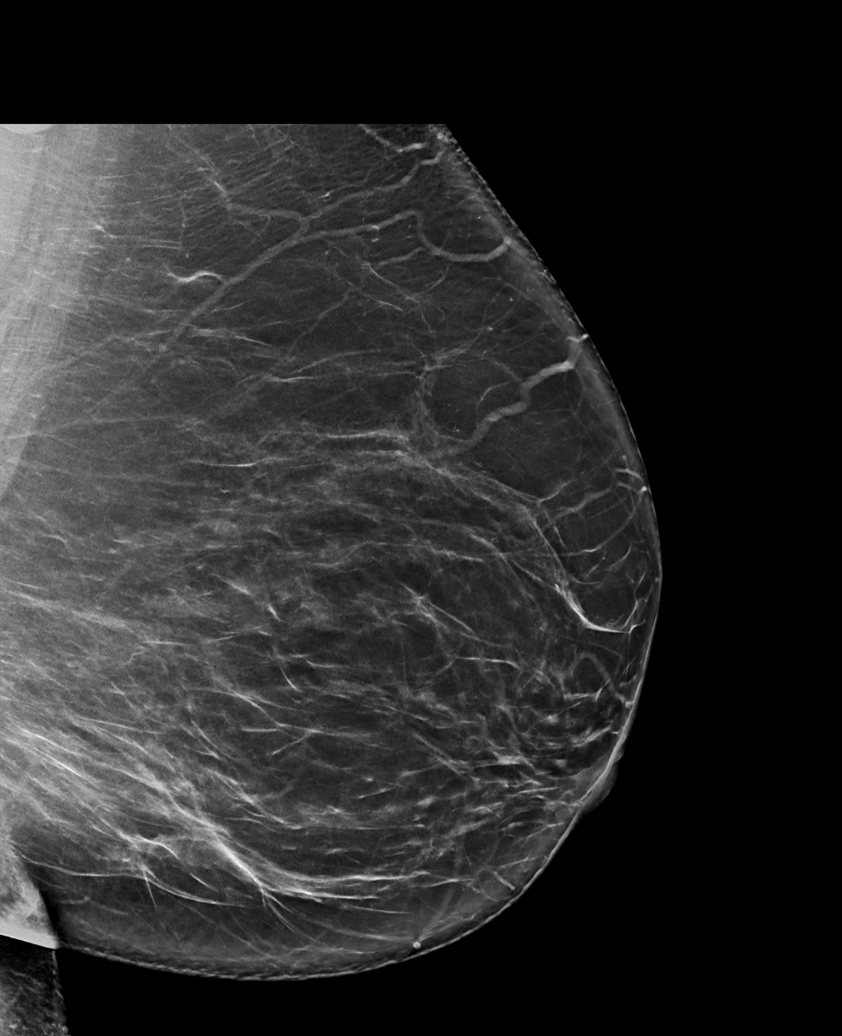

[L CC synth-2D]
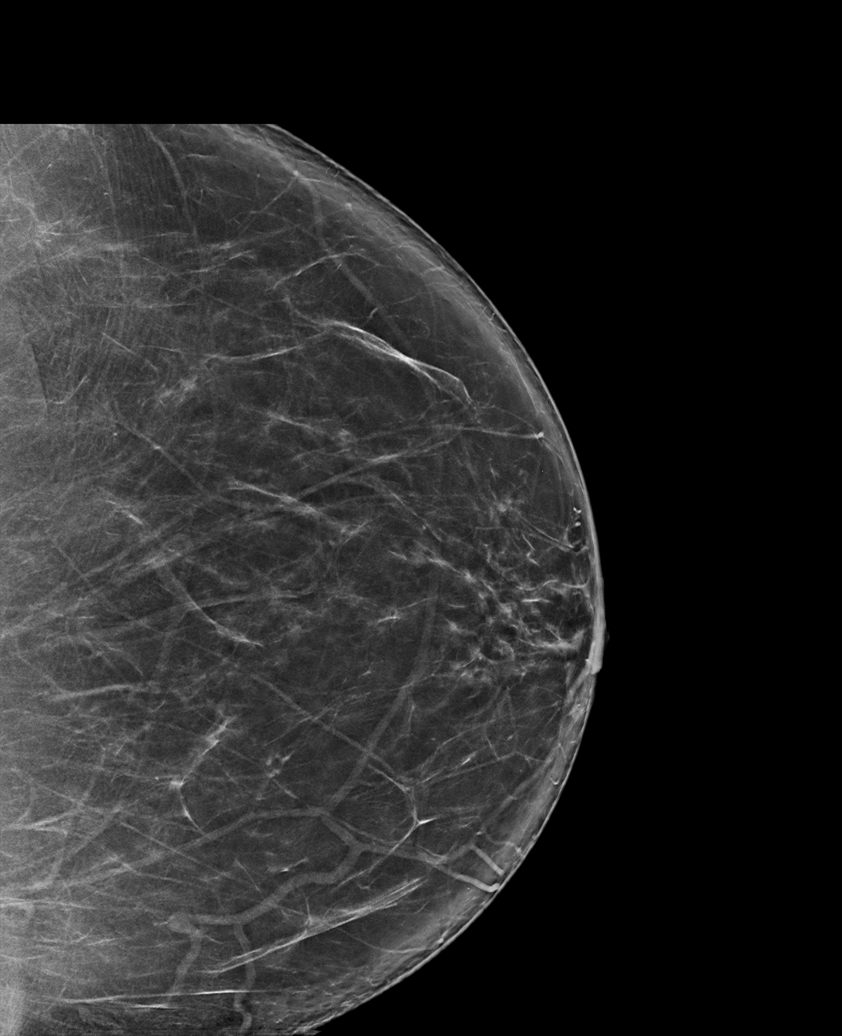

[R CV synth-2D]
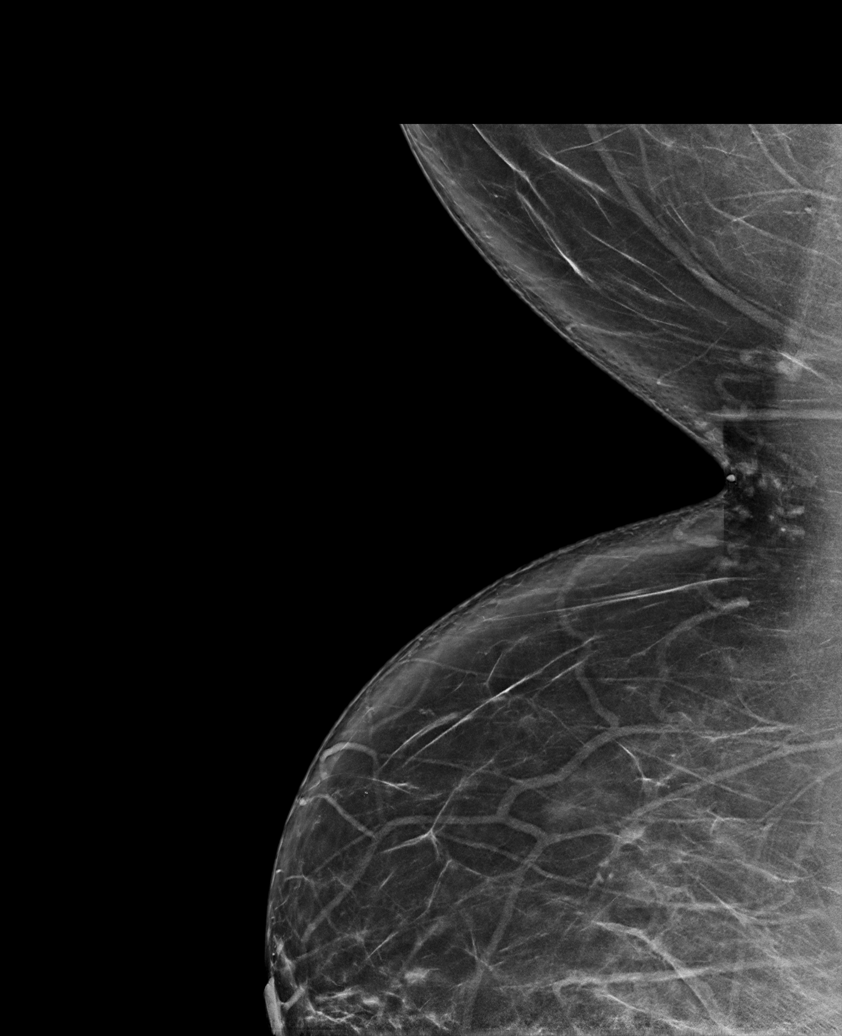

[L CC tomo · tomo slice 47/94.0]
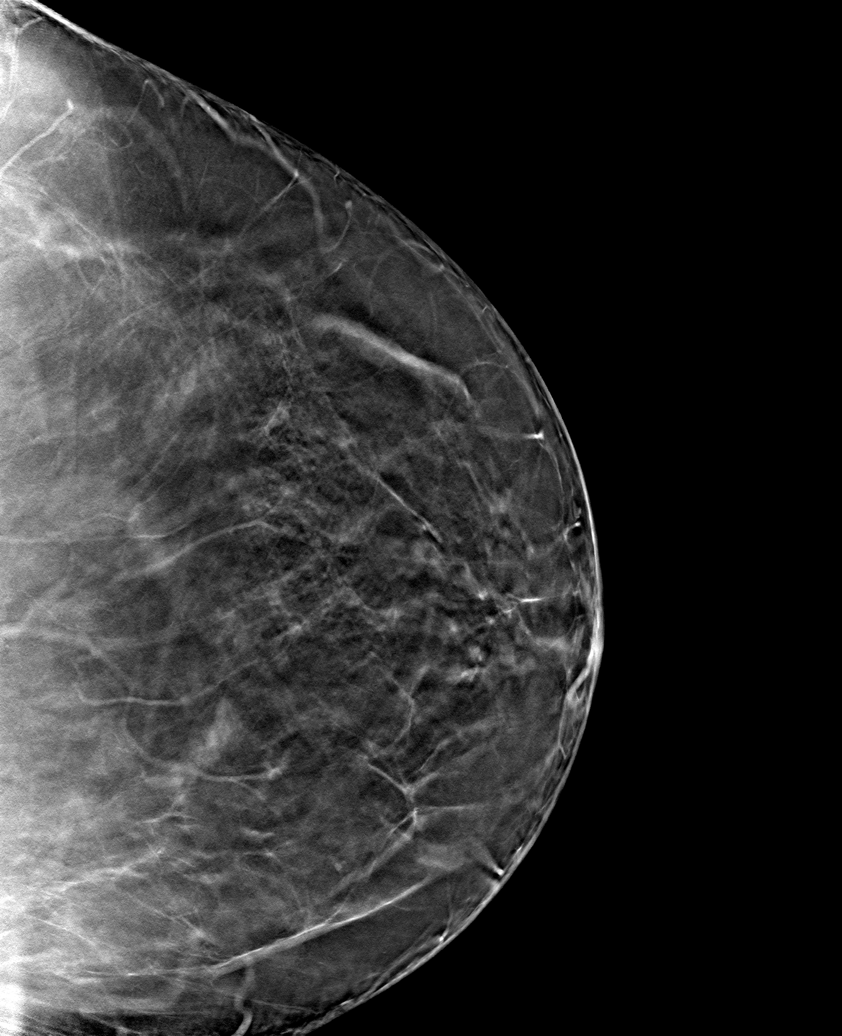

[6 of 30 positions shown; findings below may reference images not displayed]

ACR Breast Density Category b: There are scattered areas of
fibroglandular density.
FINDINGS: There are no findings suspicious for malignancy. The images were
evaluated with computer-aided detection.
IMPRESSION: No mammographic evidence of malignancy. A result letter of this
screening mammogram will be mailed directly to the patient.

RECOMMENDATION:
Screening mammogram in one year. (Code:[id number])

BI-RADS CATEGORY  1: Negative.

## 2022-09-18 NOTE — Telephone Encounter (Signed)
Medication refill request: aygestin 5mg   Last AEX:  06/15/21 Next AEX: 10/11/22 Last MMG (if hormonal medication request): 02/14/22 Refill authorized: #[id number] with 0 rf pended for today to get her to her aex

## 2022-10-09 NOTE — Progress Notes (Unsigned)
52 y.o. [id number] Married White or Caucasian Not Hispanic or Latino female here for annual exam. H/O AUB, controlled with daily aygestin. No vasomotor symptoms.   She states that she is cold often. She states that she gets cold spots in her legs. Hypothyroid, on medication. Managed by primary. Can't get into Endocrinology until 12/24.   She has seen a nutritionist, is losing weight.   She had vaginal itching at night. This has been going on for a year. No help with Vaseline.   She is also having some bladder leakage with coughing and sneezing. She has been ill since 07/24/22 with URI, so coughing a lot.  She feels like she is getting more UTI's    No LMP on daily aygestin   Sexually active: Yes.    The current method of family planning is vasectomy Exercising: Yes.     Walking  Smoker:  no  Health Maintenance: Pap:  05/22/19 WNL Hr HPV Neg  History of abnormal Pap:  no MMG:  02/14/22 density A Bi-rads 1 neg  BMD:   n/a Colonoscopy: never  TDaP:  2019  Gardasil: none    reports that she has never smoked. She has never used smokeless tobacco. She reports that she does not drink alcohol and does not use drugs. 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   Past Medical History:  Diagnosis Date   Anemia    Asthma    Cervicitis    GERD (gastroesophageal reflux disease)    H. pylori infection    Migraines    Obesity    RLQ abdominal pain     Past Surgical History:  Procedure Laterality Date   APPENDECTOMY     DILATION AND CURETTAGE OF UTERUS  10/10/2005   HERNIA REPAIR     LAPAROSCOPY  06/13/1999   REDUCTION MAMMAPLASTY  06/2021   UMBILICAL HERNIA REPAIR      Current Outpatient Medications  Medication Sig Dispense Refill   albuterol (VENTOLIN HFA) 108 (90 Base) MCG/ACT inhaler Inhale into the lungs every 6 (six) hours as needed for wheezing or shortness of breath.     Cholecalciferol (VITAMIN D3) 1.25 MG (50000 UT) TABS Take 2,000 Units by mouth daily.     docusate sodium (COLACE) 100  MG capsule Take 100 mg by mouth daily.     EPINEPHrine 0.3 mg/0.3 mL IJ SOAJ injection Inject 0.3 mg into the muscle as needed for anaphylaxis (call 911).      Ferrous Sulfate (IRON PO) Take 65 mg by mouth once a week.     ibuprofen (ADVIL) 200 MG tablet Take 600 mg by mouth every 6 (six) hours as needed for headache or moderate pain.     levothyroxine (SYNTHROID) 50 MCG tablet Take 50 mcg by mouth daily before breakfast.     loratadine (CLARITIN) 10 MG tablet Take 10 mg by mouth daily.     LORazepam (ATIVAN) 0.5 MG tablet Take 0.5 mg by mouth every 4 (four) hours as needed for anxiety.      losartan-hydrochlorothiazide (HYZAAR) 100-25 MG tablet Take 1 tablet by mouth daily.     norethindrone (AYGESTIN) 5 MG tablet TAKE 1/2 TABLET BY MOUTH DAILY 15 tablet 0   omeprazole (PRILOSEC) 20 MG capsule Take 20 mg by mouth daily.     rosuvastatin (CRESTOR) 5 MG tablet Take 5 mg by mouth at bedtime.     Spacer/Aero-Holding Chambers (AEROCHAMBER PLUS FLO-VU LARGE) MISC See admin instructions.     SYMBICORT 160-4.5 MCG/ACT inhaler  SMARTSIG:2 Puff(s) By Mouth Twice Daily     vitamin B-12 (CYANOCOBALAMIN) 1000 MCG tablet Take 1,000 mcg by mouth daily.     montelukast (SINGULAIR) 10 MG tablet Take 10 mg by mouth daily.     No current facility-administered medications for this visit.    Family History  Problem Relation Age of Onset   Cancer Father        brain   Hypertension Father    Diabetes Maternal Uncle    Heart disease Maternal Grandmother    Stroke Maternal Grandfather    Diabetes Maternal Grandfather    Cancer Paternal Grandmother        ovarian   Hyperlipidemia Paternal Grandmother    Cancer Paternal Grandfather        prostate   Diabetes Paternal Grandfather    Hyperlipidemia Paternal Grandfather    Hyperlipidemia Mother    Hypertension Mother     Review of Systems  All other systems reviewed and are negative.   Exam:   BP 128/82   Pulse 79   Ht 5\' 6"  (1.676 m)   Wt 270 lb  (122.5 kg)   SpO2 100%   BMI 43.58 kg/m   Weight change: @WEIGHTCHANGE @ Height:   Height: 5\' 6"  (167.6 cm)  Ht Readings from Last 3 Encounters:  10/11/22 5\' 6"  (1.676 m)  02/20/22 5\' 6"  (1.676 m)  12/05/21 5\' 6"  (1.676 m)    General appearance: alert, cooperative and appears stated age Head: Normocephalic, without obvious abnormality, atraumatic Neck: no adenopathy, supple, symmetrical, trachea midline and thyroid normal to inspection and palpation Lungs: clear to auscultation bilaterally Cardiovascular: regular rate and rhythm Breasts: normal appearance, no masses or tenderness Abdomen: soft, non-tender; non distended,  no masses,  no organomegaly Extremities: extremities normal, atraumatic, no cyanosis or edema Skin: Skin color, texture, turgor normal. No rashes or lesions Lymph nodes: Cervical, supraclavicular, and axillary nodes normal. No abnormal inguinal nodes palpated Neurologic: Grossly normal   Pelvic: External genitalia:  no lesions              Urethra:  normal appearing urethra with no masses, tenderness or lesions              Bartholins and Skenes: normal                 Vagina: normal appearing vagina with normal color and discharge, no lesions              Cervix: no lesions               Bimanual Exam:  Uterus:   no masses or tenderness              Adnexa: no mass, fullness, tenderness               Rectovaginal: Confirms               Anus:  normal sphincter tone, no lesions  Cornelia Copa, CMA chaperoned for the exam.  1. Well woman exam Discussed breast self exam Discussed calcium and vit D intake No pap this year Mammogram due in the fall Labs with primary  2. History of abnormal uterine bleeding She has been on long term daily aygestin. No menopausal symptoms. - Follicle stimulating hormone - norethindrone (AYGESTIN) 5 MG tablet; TAKE 1/2 TABLET BY MOUTH DAILY  Dispense: 45 tablet; Refill: 3  3. Urinary incontinence in female -  Urinalysis,Complete w/RFL Culture -Kegel information given -Discussed options of PT or  surgery  4. Chronic vulvitis Discussed vulvar skin care - WET PREP FOR TRICH, YEAST, CLUE: negative - betamethasone valerate ointment (VALISONE) 0.1 %; Apply 1 Application topically 2 (two) times daily. Use for up to 2 weeks as needed  Dispense: 30 g; Refill: 0 -Vaginitis panel sent  5. Colon cancer screening - Ambulatory referral to Gastroenterology

## 2022-10-11 ENCOUNTER — Ambulatory Visit (INDEPENDENT_AMBULATORY_CARE_PROVIDER_SITE_OTHER): Payer: Managed Care, Other (non HMO) | Admitting: Obstetrics and Gynecology

## 2022-10-11 ENCOUNTER — Encounter: Payer: Self-pay | Admitting: Obstetrics and Gynecology

## 2022-10-11 VITALS — BP 128/82 | HR 79 | Ht 66.0 in | Wt 270.0 lb

## 2022-10-11 DIAGNOSIS — R32 Unspecified urinary incontinence: Secondary | ICD-10-CM

## 2022-10-11 DIAGNOSIS — Z8742 Personal history of other diseases of the female genital tract: Secondary | ICD-10-CM

## 2022-10-11 DIAGNOSIS — N763 Subacute and chronic vulvitis: Secondary | ICD-10-CM | POA: Diagnosis not present

## 2022-10-11 DIAGNOSIS — Z01419 Encounter for gynecological examination (general) (routine) without abnormal findings: Secondary | ICD-10-CM

## 2022-10-11 DIAGNOSIS — Z1211 Encounter for screening for malignant neoplasm of colon: Secondary | ICD-10-CM

## 2022-10-11 LAB — WET PREP FOR TRICH, YEAST, CLUE

## 2022-10-11 MED ORDER — NORETHINDRONE ACETATE 5 MG PO TABS
ORAL_TABLET | ORAL | 3 refills | Status: DC
Start: 2022-10-11 — End: 2023-10-15

## 2022-10-11 MED ORDER — BETAMETHASONE VALERATE 0.1 % EX OINT
1.0000 | TOPICAL_OINTMENT | Freq: Two times a day (BID) | CUTANEOUS | 0 refills | Status: DC
Start: 2022-10-11 — End: 2023-05-31

## 2022-10-11 NOTE — Addendum Note (Signed)
Addended by: Rushie Goltz on: 10/11/2022 12:30 PM   Modules accepted: Orders

## 2022-10-11 NOTE — Patient Instructions (Signed)
Kegel Exercises  Kegel exercises can help strengthen your pelvic floor muscles. The pelvic floor is a group of muscles that support your rectum, small intestine, and bladder. In females, pelvic floor muscles also help support the uterus. These muscles help you control the flow of urine and stool (feces). Kegel exercises are painless and simple. They do not require any equipment. Your provider may suggest Kegel exercises to: Improve bladder and bowel control. Improve sexual response. Improve weak pelvic floor muscles after surgery to remove the uterus (hysterectomy) or after pregnancy, in females. Improve weak pelvic floor muscles after prostate gland removal or surgery, in males. Kegel exercises involve squeezing your pelvic floor muscles. These are the same muscles you squeeze when you try to stop the flow of urine or keep from passing gas. The exercises can be done while sitting, standing, or lying down, but it is best to vary your position. Ask your health care provider which exercises are safe for you. Do exercises exactly as told by your health care provider and adjust them as directed. Do not begin these exercises until told by your health care provider. Exercises How to do Kegel exercises: Squeeze your pelvic floor muscles tight. You should feel a tight lift in your rectal area. If you are a female, you should also feel a tightness in your vaginal area. Keep your stomach, buttocks, and legs relaxed. Hold the muscles tight for up to 10 seconds. Breathe normally. Relax your muscles for up to 10 seconds. Repeat as told by your health care provider. Repeat this exercise daily as told by your health care provider. Continue to do this exercise for at least 4-6 weeks, or for as long as told by your health care provider. You may be referred to a physical therapist who can help you learn more about how to do Kegel exercises. Depending on your condition, your health care provider may  recommend: Varying how long you squeeze your muscles. Doing several sets of exercises every day. Doing exercises for several weeks. Making Kegel exercises a part of your regular exercise routine. This information is not intended to replace advice given to you by your health care provider. Make sure you discuss any questions you have with your health care provider. Document Revised: 10/07/2020 Document Reviewed: 10/07/2020 Elsevier Patient Education  2023 [employer]  

## 2022-10-12 LAB — SURESWAB® ADVANCED VAGINITIS,TMA
CANDIDA SPECIES: NOT DETECTED
Candida glabrata: NOT DETECTED
SURESWAB(R) ADV BACTERIAL VAGINOSIS(BV),TMA: NEGATIVE
TRICHOMONAS VAGINALIS (TV),TMA: NOT DETECTED

## 2022-10-14 LAB — URINALYSIS, COMPLETE W/RFL CULTURE
Bilirubin Urine: NEGATIVE
Glucose, UA: NEGATIVE
Hyaline Cast: NONE SEEN /LPF
Ketones, ur: NEGATIVE
Nitrites, Initial: NEGATIVE
Protein, ur: NEGATIVE
Specific Gravity, Urine: 1.015 (ref 1.001–1.035)
pH: 7.5 (ref 5.0–8.0)

## 2022-10-14 LAB — URINE CULTURE
MICRO NUMBER:: 14898728
SPECIMEN QUALITY:: ADEQUATE

## 2022-10-14 LAB — CULTURE INDICATED

## 2022-10-15 ENCOUNTER — Other Ambulatory Visit: Payer: Self-pay | Admitting: Obstetrics and Gynecology

## 2022-10-15 MED ORDER — CIPROFLOXACIN HCL 500 MG PO TABS: 500.0000 mg | ORAL_TABLET | Freq: Two times a day (BID) | ORAL | 0 refills | Status: DC

## 2022-10-16 ENCOUNTER — Other Ambulatory Visit: Payer: Self-pay

## 2022-10-16 NOTE — Telephone Encounter (Addendum)
I spoke with patient and informed her of My Chart message information/results.  Patient said she cannot take Cipro as it makes her vomit and she is unable to keep anything down when taking it.    She is also allergic to Sulfa Meds.  She said she has taken Macrobid in past with no issues.  She asked if another Rx you could prescribe?

## 2022-10-16 NOTE — Telephone Encounter (Signed)
-----   Message from Patton Salles, MD sent at 10/15/2022  6:38 PM EDT ----- Please be sure that the patient has received this message and her prescription. She has several allergies to commonly used antibiotics to treat UTI.  Hi Yolanda Mendez,   This is Dr. Edward Jolly reviewing Dr. Salli Quarry labs while she is out of the office.   Your urine culture is showing infection with Klebsiella.  I am recommending treatment with Ciprofloxacin 500 mg by mouth twice a day for 5 days. I will send this to your [facility] at [address].   I will have the nurse check tomorrow to be sure you received this message and the antibiotic.   Please contact the office for any questions.   Have a good evening!  Conley Simmonds, MD

## 2022-10-17 ENCOUNTER — Other Ambulatory Visit: Payer: Self-pay

## 2022-10-17 MED ORDER — FOSFOMYCIN TROMETHAMINE 3 G PO PACK: 3.0000 g | PACK | Freq: Once | ORAL | 0 refills | Status: AC

## 2022-10-17 NOTE — Telephone Encounter (Signed)
I spoke with Dr. Edward Jolly and per our conversation I advised patient if no anaphylactic reaction to Keflex and no anaphylactic reaction with Amoxicillin/Penicillin the Augmentin is not contraindicated.  I did tell patient that Dr. Edward Jolly said if she is very anxious about taking it the other alternative is Fosfomycin 3 grams orally once.   Patient said she would prefer to have the Fosfomycin. Rx sent to her pharmacy.

## 2022-10-17 NOTE — Telephone Encounter (Signed)
Please cancel the Ciprofloxacin.   She can take Augmentin 500 mg/125 mg by mouth twice daily for 5 days.  This is an amoxicillin antibiotic.

## 2022-10-17 NOTE — Telephone Encounter (Signed)
Spoke with patient and informed her. Called and cancelled Rx with Pharmacy.  Dr. Edward Jolly, please sign Rx. There is a pop [place] allergy/contraindication.  Thanks

## 2022-10-17 NOTE — Telephone Encounter (Signed)
Dr. Edward Jolly, I spoke with patient. She has never had anaphylatic reaction to anything except mold, peanuts and walnuts.  She does not recall ever having a reaction to Penicillin or Amoxicillin.  She seems a bit anxious about trying that Augmentin stating that it seems like a high powered antibiotic and "if it can happen it will happen to me".  She did say she is willing to try the Augmentin if that is what Dr. Edward Jolly recommends.

## 2023-01-30 ENCOUNTER — Other Ambulatory Visit: Payer: Self-pay | Admitting: Family Medicine

## 2023-01-30 ENCOUNTER — Encounter: Payer: Self-pay | Admitting: Internal Medicine

## 2023-01-30 DIAGNOSIS — Z1231 Encounter for screening mammogram for malignant neoplasm of breast: Secondary | ICD-10-CM

## 2023-02-20 ENCOUNTER — Ambulatory Visit
Admission: RE | Admit: 2023-02-20 | Discharge: 2023-02-20 | Disposition: A | Discharge status: Home / Self Care | Payer: Managed Care, Other (non HMO) | Source: Ambulatory Visit | Attending: Family Medicine | Admitting: Family Medicine

## 2023-02-20 DIAGNOSIS — Z1231 Encounter for screening mammogram for malignant neoplasm of breast: Secondary | ICD-10-CM

## 2023-02-27 ENCOUNTER — Ambulatory Visit (AMBULATORY_SURGERY_CENTER): Payer: Managed Care, Other (non HMO) | Admitting: *Deleted

## 2023-02-27 VITALS — Ht 66.0 in | Wt 258.0 lb

## 2023-02-27 DIAGNOSIS — Z1211 Encounter for screening for malignant neoplasm of colon: Secondary | ICD-10-CM

## 2023-02-27 MED ORDER — NA SULFATE-K SULFATE-MG SULF 17.5-3.13-1.6 GM/177ML PO SOLN
1.0000 | Freq: Once | ORAL | 0 refills | Status: AC
Start: 2023-02-27 — End: 2023-02-27

## 2023-02-27 NOTE — Progress Notes (Signed)
No egg or soy allergy known to patient  No issues known to pt with past sedation with any surgeries or procedures Patient denies ever being told they had issues or difficulty with intubation  No FH of Malignant Hyperthermia Pt is on diet pills, Pt is not on  home 02  Pt is not on blood thinners  Pt denies issues with constipation  No A fib or A flutter Have any cardiac testing pending--no Pt instructed to use [email or web address] or GoodRx for a price reduction on prep    Patient's chart reviewed by Cathlyn Parsons CNRA prior to previsit and patient appropriate for the [facility].  Previsit completed and red dot placed by patient's name on their procedure day (on provider's schedule).

## 2023-03-23 ENCOUNTER — Encounter: Payer: Managed Care, Other (non HMO) | Admitting: Internal Medicine

## 2023-05-31 ENCOUNTER — Ambulatory Visit: Payer: Managed Care, Other (non HMO) | Admitting: Internal Medicine

## 2023-05-31 ENCOUNTER — Encounter: Payer: Self-pay | Admitting: Internal Medicine

## 2023-05-31 VITALS — BP 126/78 | HR 100 | Ht 66.0 in | Wt 263.0 lb

## 2023-05-31 DIAGNOSIS — Z7984 Long term (current) use of oral hypoglycemic drugs: Secondary | ICD-10-CM | POA: Diagnosis not present

## 2023-05-31 DIAGNOSIS — Z23 Encounter for immunization: Secondary | ICD-10-CM | POA: Diagnosis not present

## 2023-05-31 DIAGNOSIS — E039 Hypothyroidism, unspecified: Secondary | ICD-10-CM

## 2023-05-31 DIAGNOSIS — E119 Type 2 diabetes mellitus without complications: Secondary | ICD-10-CM | POA: Diagnosis not present

## 2023-05-31 DIAGNOSIS — E785 Hyperlipidemia, unspecified: Secondary | ICD-10-CM | POA: Diagnosis not present

## 2023-05-31 LAB — POCT GLYCOSYLATED HEMOGLOBIN (HGB A1C): Hemoglobin A1C: 6.6 % — AB (ref 4.0–5.6)

## 2023-05-31 LAB — POCT GLUCOSE (DEVICE FOR HOME USE): POC Glucose: 156 mg/dL — AB (ref 70–99)

## 2023-05-31 MED ORDER — EMPAGLIFLOZIN 10 MG PO TABS: 10.0000 mg | ORAL_TABLET | Freq: Every day | ORAL | 3 refills | Status: DC

## 2023-05-31 NOTE — Progress Notes (Signed)
Name: Yolanda Mendez  MRN/ DOB: [id number], May 19, 1971   Age/ Sex: 52 y.o., female    PCP: Lewis Moccasin, MD   Reason for Endocrinology Evaluation: Type 2 Diabetes Mellitus     Date of Initial Endocrinology Visit: 05/31/2023     PATIENT IDENTIFIER: [name] is a 52 y.o. female with a past medical history of DM, HTN, Hypothyroidism, and asthma. The patient presented for initial endocrinology clinic visit on 05/31/2023 for consultative assistance with her diabetes management.    HPI: Yolanda Mendez was    Diagnosed with DM 10/2022 Prior Medications tried/Intolerance: Metformin-syncope, Mounjaro -GI upset. Ozempic - hypoglycemia on Freestyle libre  Currently checking blood sugars 0 x / day  Hemoglobin A1c has ranged from 6.2% , peaking at 6.7% in 2024.   In terms of diet, the patient eats a protein bar for breakfast and supper . Does not snack , avoids sugar-sweetened beverages   Has chronic GERD  Denies constipation or diarrhea   She received glucocorticoids ~ 4 weeks ago  Denies local neck swelling  No prior neck sx or RAI   Right arm tingling, worse in the morning, PCP prescribed a topical  HOME DIABETES REGIMEN: N/a   Statin: yes ACE-I/ARB: yes      METER DOWNLOAD SUMMARY:     DIABETIC COMPLICATIONS: Microvascular complications:   Denies: CKD, neuropathy  Last eye exam: Completed 06/2022  Macrovascular complications:   Denies: CAD, PVD, CVA   PAST HISTORY: Past Medical History:  Past Medical History:  Diagnosis Date   Allergy    seasonal   Anemia    Asthma    Cervicitis    Diabetes mellitus without complication (HCC)    GERD (gastroesophageal reflux disease)    H. pylori infection    Hyperlipidemia    Hypertension    Migraines    Obesity    RLQ abdominal pain    Thyroid disease    hypo   Past Surgical History:  Past Surgical History:  Procedure Laterality Date   APPENDECTOMY     DILATION AND CURETTAGE OF UTERUS   10/10/2005   HERNIA REPAIR     LAPAROSCOPY  06/13/1999   REDUCTION MAMMAPLASTY  06/2021   UMBILICAL HERNIA REPAIR      Social History:  reports that she has never smoked. She has never used smokeless tobacco. She reports that she does not drink alcohol and does not use drugs. Family History:  Family History  Problem Relation Age of Onset   Hyperlipidemia Mother    Hypertension Mother    Cancer Father        brain   Hypertension Father    Diabetes Maternal Uncle    Heart disease Maternal Grandmother    Stroke Maternal Grandfather    Diabetes Maternal Grandfather    Cancer Paternal Grandmother        ovarian   Hyperlipidemia Paternal Grandmother    Cancer Paternal Grandfather        prostate   Diabetes Paternal Grandfather    Hyperlipidemia Paternal Grandfather    Colon cancer Neg Hx    Colon polyps Neg Hx    Crohn's disease Neg Hx    Esophageal cancer Neg Hx    Rectal cancer Neg Hx    Stomach cancer Neg Hx    Ulcerative colitis Neg Hx      HOME MEDICATIONS: Allergies as of 05/31/2023       Reactions   Other Shortness Of Breath, Swelling  Walnuts and pecans   Keflex [cephalexin] Hives   Macrolides And Ketolides    Sulfa Antibiotics Nausea And Vomiting        Medication List        Accurate as of May 31, 2023  9:41 AM. If you have any questions, ask your nurse or doctor.          STOP taking these medications    betamethasone valerate ointment 0.1 % Commonly known as: VALISONE Stopped by: Johnney Ou Briel Gallicchio       TAKE these medications    AeroChamber Plus Flo-Vu Large Misc See admin instructions.   cyanocobalamin 1000 MCG tablet Commonly known as: VITAMIN B12 Take 1,000 mcg by mouth daily.   cyclobenzaprine 10 MG tablet Commonly known as: FLEXERIL Take by mouth.   EPINEPHrine 0.3 mg/0.3 mL Soaj injection Commonly known as: EPI-PEN Inject 0.3 mg into the muscle as needed for anaphylaxis (call 911).   ibuprofen 200 MG  tablet Commonly known as: ADVIL Take 600 mg by mouth every 6 (six) hours as needed for headache or moderate pain.   IRON PO Take 65 mg by mouth once a week.   levothyroxine 50 MCG tablet Commonly known as: SYNTHROID Take 50 mcg by mouth daily before breakfast.   loratadine 10 MG tablet Commonly known as: CLARITIN Take 10 mg by mouth daily.   LORazepam 0.5 MG tablet Commonly known as: ATIVAN Take 0.5 mg by mouth every 4 (four) hours as needed for anxiety.   montelukast 10 MG tablet Commonly known as: SINGULAIR Take 10 mg by mouth daily.   Na Sulfate-K Sulfate-Mg Sulf 17.5-3.13-1.6 GM/177ML Soln SMARTSIG:1 Kit(s) By Mouth Once   norethindrone 5 MG tablet Commonly known as: AYGESTIN TAKE 1/2 TABLET BY MOUTH DAILY   omeprazole 20 MG capsule Commonly known as: PRILOSEC Take 20 mg by mouth daily.   phentermine 37.5 MG tablet Commonly known as: ADIPEX-P Take 18.75-37.5 mg by mouth every morning.   rosuvastatin 5 MG tablet Commonly known as: CRESTOR Take 5 mg by mouth at bedtime.   Symbicort 160-4.5 MCG/ACT inhaler Generic drug: budesonide-formoterol as needed.   valsartan-hydrochlorothiazide 160-25 MG tablet Commonly known as: DIOVAN-HCT Take 1 tablet by mouth daily.   Ventolin HFA 108 (90 Base) MCG/ACT inhaler Generic drug: albuterol Inhale into the lungs every 6 (six) hours as needed for wheezing or shortness of breath.   Vitamin D3 1.25 MG (50000 UT) Tabs Take 2,000 Units by mouth daily.         ALLERGIES: Allergies  Allergen Reactions   Other Shortness Of Breath and Swelling    Walnuts and pecans   Keflex [Cephalexin] Hives   Macrolides And Ketolides    Sulfa Antibiotics Nausea And Vomiting     REVIEW OF SYSTEMS: A comprehensive ROS was conducted with the patient and is negative except as per HPI   OBJECTIVE:   VITAL SIGNS: BP 126/78 (BP Location: Left Arm, Patient Position: Sitting, Cuff Size: Large)   Pulse 100   Ht 5\' 6"  (1.676 m)   Wt  263 lb (119.3 kg)   SpO2 97%   BMI 42.45 kg/m    PHYSICAL EXAM:  General: Pt appears well and is in NAD  Neck: General: Supple without adenopathy or carotid bruits. Thyroid: Thyroid size normal.  No goiter or nodules appreciated.   Lungs: Clear with good BS bilat   Heart: RRR   Abdomen:  soft, nontender  Extremities:  Lower extremities - No pretibial edema.   Neuro: MS  is good with appropriate affect, pt is alert and Ox3    DM foot exam:    DATA REVIEWED:  No results found for: "HGBA1C" Lab Results  Component Value Date   CREATININE 0.80 04/15/2020    In office BG 156 mg/dL    ASSESSMENT / PLAN / RECOMMENDATIONS:   1) Type 2 Diabetes Mellitus, Optimally controlled, Without complications - Most recent A1c of 6.6 %. Goal A1c < 7.0 %.    Plan: GENERAL: I have discussed with the patient the pathophysiology of diabetes. We went over the natural progression of the disease. We stressed the importance of lifestyle changes. I explained the complications associated with diabetes including retinopathy, nephropathy, neuropathy as well as increased risk of cardiovascular disease. We went over the benefit seen with glycemic control.   I explained to the patient that diabetic patients are at higher than normal risk for amputations.  She endorses syncope with metformin She endorses GI side effects to [facility] She did endorse hypoglycemia on Ozempic but she was wearing freestyle libre, which I believe these were skewed readings Today we discussed SGLT2 inhibitors, caution against genital infections, advised the patient to take probiotics  MEDICATIONS: Jardiance 10 mg daily  EDUCATION / INSTRUCTIONS: BG monitoring instructions: Patient is instructed to check her blood sugars 2-3 times a week. Call [facility] if: BG persistently < 70  I reviewed the Rule of 15 for the treatment of hypoglycemia in detail with the patient. Literature supplied.   2) Diabetic  complications:  Eye: Does not have known diabetic retinopathy.  Neuro/ Feet: Does not have known diabetic peripheral neuropathy. Renal: Patient does not have known baseline CKD. She is  on an ACEI/ARB at present.  3) Dyslipidemia:  -Discussed cardiovascular benefits of statin therapy -Encouraged use of a statin intake   4) Hypothyroidism   .- Pt educated extensively on the correct way to take levothyroxine (first thing in the morning with water, 30 minutes before eating or taking other medications). - Pt encouraged to double dose the following day if she were to miss a dose given long half-life of levothyroxine.    F/U in 3 months   Signed electronically by: Lyndle Herrlich, MD  [facility]  [facility] [address] [place] [place], [place] [address] Phone: [phone] FAX: [phone]   CC: Lewis Moccasin, MD [address] [place] [address] Phone: [phone]  Fax: [phone]    Return to Endocrinology clinic as below: No future appointments.

## 2023-05-31 NOTE — Patient Instructions (Addendum)
Start Jardiance 10 mg , 1 tablet daily      You are on levothyroxine - which is your thyroid hormone supplement. You MUST take this consistently.  You should take this first thing in the morning on an empty stomach with water. You should not take it with other medications. Wait to 1hr prior to eating. If you are taking any vitamins - please take these in the evening.   If you miss a dose, please take your missed dose the following day (double the dose for that day). You should have a pill box for ONLY levothyroxine on your bedside table to help you remember to take your medications. Patient does have

## 2023-06-12 ENCOUNTER — Telehealth: Payer: Self-pay | Admitting: Internal Medicine

## 2023-06-12 NOTE — Telephone Encounter (Signed)
 Patient called and requested Progress Notes be sent to Referring Office - Dr Duanne Guess

## 2023-06-14 ENCOUNTER — Other Ambulatory Visit (HOSPITAL_COMMUNITY): Payer: Self-pay

## 2023-06-14 ENCOUNTER — Telehealth: Payer: Self-pay

## 2023-06-14 NOTE — Telephone Encounter (Signed)
 Office note sent to Dr. Rosario Adie

## 2023-06-14 NOTE — Telephone Encounter (Signed)
 Pharmacy Patient Advocate Encounter   Received notification from Pt Calls Messages that prior authorization for Jardiance  is required/requested.   Insurance verification completed.   The patient is insured through [employer] .   Per test claim: PA required; PA submitted to above mentioned insurance via CoverMyMeds Key/confirmation #/EOC [id number] Status is pending   PA has been APPROVED through 06/11/2098 PA Case ID #: [id number]

## 2023-06-14 NOTE — Telephone Encounter (Signed)
 Jardiance needs a PA

## 2023-08-29 ENCOUNTER — Ambulatory Visit: Payer: Managed Care, Other (non HMO) | Admitting: Internal Medicine

## 2023-08-29 ENCOUNTER — Encounter: Payer: Self-pay | Admitting: Internal Medicine

## 2023-08-29 VITALS — BP 126/80 | HR 88 | Ht 66.0 in | Wt 254.0 lb

## 2023-08-29 DIAGNOSIS — E119 Type 2 diabetes mellitus without complications: Secondary | ICD-10-CM

## 2023-08-29 DIAGNOSIS — Z7984 Long term (current) use of oral hypoglycemic drugs: Secondary | ICD-10-CM

## 2023-08-29 DIAGNOSIS — E785 Hyperlipidemia, unspecified: Secondary | ICD-10-CM | POA: Diagnosis not present

## 2023-08-29 DIAGNOSIS — E039 Hypothyroidism, unspecified: Secondary | ICD-10-CM | POA: Diagnosis not present

## 2023-08-29 LAB — POCT GLYCOSYLATED HEMOGLOBIN (HGB A1C): Hemoglobin A1C: 6 % — AB (ref 4.0–5.6)

## 2023-08-29 LAB — POCT GLUCOSE (DEVICE FOR HOME USE): Glucose Fasting, POC: 109 mg/dL — AB (ref 70–99)

## 2023-08-29 MED ORDER — EMPAGLIFLOZIN 10 MG PO TABS
10.0000 mg | ORAL_TABLET | Freq: Every day | ORAL | 3 refills | Status: DC
Start: 2023-08-29 — End: 2023-09-17

## 2023-08-29 MED ORDER — LEVOTHYROXINE SODIUM 50 MCG PO TABS: 50.0000 ug | ORAL_TABLET | Freq: Every day | ORAL | 3 refills | Status: DC

## 2023-08-29 MED ORDER — ROSUVASTATIN CALCIUM 5 MG PO TABS
5.0000 mg | ORAL_TABLET | Freq: Every day | ORAL | 3 refills | Status: AC
  Filled 2024-03-19 – 2024-05-14 (×2): qty 90, 90d supply, fill #0

## 2023-08-29 NOTE — Progress Notes (Signed)
 Name: Yolanda Mendez  MRN/ DOB: [id number], Jul 27, 1970   Age/ Sex: 53 y.o., female    PCP: Lewis Moccasin, MD   Reason for Endocrinology Evaluation: Type 2 Diabetes Mellitus     Date of Initial Endocrinology Visit: 05/31/2023    PATIENT IDENTIFIER: [name] is a 53 y.o. female with a past medical history of DM, HTN, Hypothyroidism, and asthma. The patient presented for initial endocrinology clinic visit on 05/31/2023 for consultative assistance with her diabetes management.    HPI: [name] was    Diagnosed with DM 10/2022 Prior Medications tried/Intolerance: Metformin-syncope, Mounjaro -GI upset. Ozempic - hypoglycemia on Freestyle libre   Hemoglobin A1c has ranged from 6.2% , peaking at 6.7% in 2024.   On her initial visit to our clinic she had an A1c of 6.6%, we started her on Jardiance   SUBJECTIVE:   During the last visit (05/31/2023): A1c 6.6%  Today (08/29/23): Yolanda Mendez Is here for follow-up on diabetes management.  She checks blood sugars 0 times daily.   Patient is going through menopause and has been having wide swings, has an upcoming appointment with GYN.  Historically she has not responded well to sertraline or Wellbutrin She denies nausea or vomiting  Denies constipation or diarrhea    HOME DIABETES REGIMEN: Jardiance 10 mg daily   Statin: yes ACE-I/ARB: yes     METER DOWNLOAD SUMMARY:     DIABETIC COMPLICATIONS: Microvascular complications:   Denies: CKD, neuropathy  Last eye exam: Completed 06/2022  Macrovascular complications:   Denies: CAD, PVD, CVA   PAST HISTORY: Past Medical History:  Past Medical History:  Diagnosis Date   Allergy    seasonal   Anemia    Asthma    Cervicitis    Diabetes mellitus without complication (HCC)    GERD (gastroesophageal reflux disease)    H. pylori infection    Hyperlipidemia    Hypertension    Migraines    Obesity    RLQ abdominal pain    Thyroid disease    hypo    Past Surgical History:  Past Surgical History:  Procedure Laterality Date   APPENDECTOMY     DILATION AND CURETTAGE OF UTERUS  10/10/2005   HERNIA REPAIR     LAPAROSCOPY  06/13/1999   REDUCTION MAMMAPLASTY  06/2021   UMBILICAL HERNIA REPAIR      Social History:  reports that she has never smoked. She has never used smokeless tobacco. She reports that she does not drink alcohol and does not use drugs. Family History:  Family History  Problem Relation Age of Onset   Hyperlipidemia Mother    Hypertension Mother    Cancer Father        brain   Hypertension Father    Diabetes Maternal Uncle    Heart disease Maternal Grandmother    Stroke Maternal Grandfather    Diabetes Maternal Grandfather    Cancer Paternal Grandmother        ovarian   Hyperlipidemia Paternal Grandmother    Cancer Paternal Grandfather        prostate   Diabetes Paternal Grandfather    Hyperlipidemia Paternal Grandfather    Colon cancer Neg Hx    Colon polyps Neg Hx    Crohn's disease Neg Hx    Esophageal cancer Neg Hx    Rectal cancer Neg Hx    Stomach cancer Neg Hx    Ulcerative colitis Neg Hx      HOME MEDICATIONS:  Allergies as of 08/29/2023       Reactions   Other Shortness Of Breath, Swelling   Walnuts and pecans   Keflex [cephalexin] Hives   Macrolides And Ketolides    Sulfa Antibiotics Nausea And Vomiting        Medication List        Accurate as of August 29, 2023  8:13 AM. If you have any questions, ask your nurse or doctor.          AeroChamber Plus Flo-Vu Large Misc See admin instructions.   cyanocobalamin 1000 MCG tablet Commonly known as: VITAMIN B12 Take 1,000 mcg by mouth daily.   cyclobenzaprine 10 MG tablet Commonly known as: FLEXERIL Take by mouth.   empagliflozin 10 MG Tabs tablet Commonly known as: Jardiance Take 1 tablet (10 mg total) by mouth daily before breakfast.   EPINEPHrine 0.3 mg/0.3 mL Soaj injection Commonly known as: EPI-PEN Inject 0.3 mg  into the muscle as needed for anaphylaxis (call 911).   ibuprofen 200 MG tablet Commonly known as: ADVIL Take 600 mg by mouth every 6 (six) hours as needed for headache or moderate pain.   IRON PO Take 65 mg by mouth once a week.   levothyroxine 50 MCG tablet Commonly known as: SYNTHROID Take 50 mcg by mouth daily before breakfast.   loratadine 10 MG tablet Commonly known as: CLARITIN Take 10 mg by mouth daily.   LORazepam 0.5 MG tablet Commonly known as: ATIVAN Take 0.5 mg by mouth every 4 (four) hours as needed for anxiety.   montelukast 10 MG tablet Commonly known as: SINGULAIR Take 10 mg by mouth daily.   Na Sulfate-K Sulfate-Mg Sulfate concentrate 17.5-3.13-1.6 GM/177ML Soln Commonly known as: SUPREP SMARTSIG:1 Kit(s) By Mouth Once   norethindrone 5 MG tablet Commonly known as: AYGESTIN TAKE 1/2 TABLET BY MOUTH DAILY   omeprazole 20 MG capsule Commonly known as: PRILOSEC Take 20 mg by mouth daily.   phentermine 37.5 MG tablet Commonly known as: ADIPEX-P Take 18.75-37.5 mg by mouth every morning.   rosuvastatin 5 MG tablet Commonly known as: CRESTOR Take 5 mg by mouth at bedtime.   Symbicort 160-4.5 MCG/ACT inhaler Generic drug: budesonide-formoterol as needed.   valsartan-hydrochlorothiazide 160-25 MG tablet Commonly known as: DIOVAN-HCT Take 1 tablet by mouth daily.   Ventolin HFA 108 (90 Base) MCG/ACT inhaler Generic drug: albuterol Inhale into the lungs every 6 (six) hours as needed for wheezing or shortness of breath.   Vitamin D3 1.25 MG (50000 UT) Tabs Take 2,000 Units by mouth daily.         ALLERGIES: Allergies  Allergen Reactions   Other Shortness Of Breath and Swelling    Walnuts and pecans   Keflex [Cephalexin] Hives   Macrolides And Ketolides    Sulfa Antibiotics Nausea And Vomiting     REVIEW OF SYSTEMS: A comprehensive ROS was conducted with the patient and is negative except as per HPI   OBJECTIVE:   VITAL SIGNS: BP  126/80 (BP Location: Left Arm, Patient Position: Sitting, Cuff Size: Small)   Pulse 88   Ht 5\' 6"  (1.676 m)   Wt 254 lb (115.2 kg)   SpO2 99%   BMI 41.00 kg/m    PHYSICAL EXAM:  General: Pt appears well and is in NAD  Neck: General: Supple without adenopathy or carotid bruits. Thyroid: Thyroid size normal.  No goiter or nodules appreciated.   Lungs: Clear with good BS bilat   Heart: RRR   Extremities:  Lower  extremities - No pretibial edema.   Neuro: MS is good with appropriate affect, pt is alert and Ox3   DM foot exam: 08/29/2023  The skin of the feet is intact without sores or ulcerations. The pedal pulses are 2+ on right and 2+ on left. The sensation is intact to a screening 5.07, 10 gram monofilament bilaterally   DATA REVIEWED:  Lab Results  Component Value Date   HGBA1C 6.6 (A) 05/31/2023   Lab Results  Component Value Date   CREATININE 0.80 04/15/2020    In office BG 156 mg/dL    ASSESSMENT / PLAN / RECOMMENDATIONS:   1) Type 2 Diabetes Mellitus, Optimally controlled, Without complications - Most recent A1c of 6.0 %. Goal A1c < 7.0 %.    -A1c is optimal -She endorses syncope with metformin - She endorses GI side effects to [facility] - She did endorse hypoglycemia on Ozempic but she was wearing freestyle libre, which I believe these were skewed readings -Will proceed with saliva cortisol   MEDICATIONS: Continue Jardiance 10 mg daily  EDUCATION / INSTRUCTIONS: BG monitoring instructions: Patient is instructed to check her blood sugars 2-3 times a week. Call [place] [facility] [facility] if: BG persistently < 70  I reviewed the Rule of 15 for the treatment of hypoglycemia in detail with the patient. Literature supplied.   2) Diabetic complications:  Eye: Does not have known diabetic retinopathy.  Neuro/ Feet: Does not have known diabetic peripheral neuropathy. Renal: Patient does not have known baseline CKD. She is  on an ACEI/ARB at present.  3)  Dyslipidemia:   -Tolerating rosuvastatin, no change  Medication  Rosuvastatin 5 mg daily     4) Hypothyroidism   -Patient is clinically euthyroid  Medication Continue levothyroxine 50 mcg daily     F/U in 6 months     Signed electronically by: Lyndle Herrlich, MD  [facility]  [facility] [address] [place] [place], [place] [address] Phone: [phone] FAX: [phone]   CC: Lewis Moccasin, MD [address] [place] [address] Phone: [phone]  Fax: [phone]    Return to Endocrinology clinic as below: Future Appointments  Date Time Provider Department Center  10/15/2023  1:30 PM Rosalyn Gess, MD [facility] None

## 2023-08-29 NOTE — Patient Instructions (Addendum)
 Continue  Jardiance 10 mg , 1 tablet daily    SALIVARY CORTISOL COLLECTION INSTRUCTIONS    Precautions:  Please collect sample at Midnight . You will need to do this on 2 nights  No food or fluids 30 minutes prior to collection.  Do not use any creams, lotions on hands, or use steroid inhalers 24- hours prior to collection.  Wash hands carefully.  Avoid any activity that could cause your gums to bleed: including flushing of brushing your teeth.  Kit must not be used in children less than 40 years of age, or a person that is at risk for choking on collection kit.  Instructions for saliva collection:   Rinse mouth thoroughly with water and discard. Do not swallow.  Hold the Salivette at the rim of the suspended insert and remove the stopper.  Remove the swab.  Place swab under tongue until well saturated, approximately 1 minute.   Return the saturated swab to the suspended insert and close the Salivette firmly with the stopper.  Do not remove the tube holding the insert. The Salivette should be sent to the lab with the swab.   Come to the lab and leave the Salivette kit for labeling with your identifying information.   Make sure you refrigerate sample if not bringing to the lab immediately. Try to use cold packs for transportation if available.

## 2023-09-03 ENCOUNTER — Other Ambulatory Visit

## 2023-09-16 LAB — CORTISOL, SALIVARY
Cortisol, Saliva: 0.04 ug/dL
Cortisol, Saliva: 0.08 ug/dL

## 2023-09-17 ENCOUNTER — Encounter: Payer: Self-pay | Admitting: Internal Medicine

## 2023-09-17 ENCOUNTER — Other Ambulatory Visit: Payer: Self-pay | Admitting: Internal Medicine

## 2023-09-17 MED ORDER — DAPAGLIFLOZIN PROPANEDIOL 5 MG PO TABS
5.0000 mg | ORAL_TABLET | Freq: Every day | ORAL | 6 refills | Status: DC
Start: 2023-09-17 — End: 2023-09-18

## 2023-09-18 ENCOUNTER — Other Ambulatory Visit: Payer: Self-pay | Admitting: Internal Medicine

## 2023-09-18 ENCOUNTER — Other Ambulatory Visit (HOSPITAL_COMMUNITY): Payer: Self-pay

## 2023-09-18 ENCOUNTER — Telehealth: Payer: Self-pay

## 2023-09-18 MED ORDER — FARXIGA 5 MG PO TABS: 5.0000 mg | ORAL_TABLET | Freq: Every day | ORAL | 6 refills | Status: DC

## 2023-09-18 NOTE — Addendum Note (Signed)
 Addended by: Lisabeth Pick on: 09/18/2023 01:23 PM   Modules accepted: Orders

## 2023-09-18 NOTE — Telephone Encounter (Signed)
 Pharmacy Patient Advocate Encounter   Received notification from RX Request Messages that prior authorization for Yolanda Mendez is required/requested.   Insurance verification completed.   The patient is insured through [employer] .   Per test claim: The current 30 day co-pay is, $25.  No PA needed at this time. This test claim was processed through [facility]- copay amounts may vary at other pharmacies due to pharmacy/plan contracts, or as the patient moves through the different stages of their insurance plan.     Insurance prefers the brand, but must have DAW 1. Please have provider resend script for Brand only with DAW 1 on the script.

## 2023-10-15 ENCOUNTER — Ambulatory Visit (INDEPENDENT_AMBULATORY_CARE_PROVIDER_SITE_OTHER): Admitting: Obstetrics and Gynecology

## 2023-10-15 ENCOUNTER — Other Ambulatory Visit (HOSPITAL_COMMUNITY)
Admission: RE | Admit: 2023-10-15 | Discharge: 2023-10-15 | Disposition: A | Discharge status: Home / Self Care | Source: Ambulatory Visit | Attending: Obstetrics and Gynecology | Admitting: Obstetrics and Gynecology

## 2023-10-15 ENCOUNTER — Encounter: Payer: Self-pay | Admitting: Obstetrics and Gynecology

## 2023-10-15 VITALS — BP 134/76 | HR 89 | Temp 97.9°F | Ht 66.5 in | Wt 252.0 lb

## 2023-10-15 DIAGNOSIS — N958 Other specified menopausal and perimenopausal disorders: Secondary | ICD-10-CM | POA: Diagnosis not present

## 2023-10-15 DIAGNOSIS — Z124 Encounter for screening for malignant neoplasm of cervix: Secondary | ICD-10-CM

## 2023-10-15 DIAGNOSIS — N951 Menopausal and female climacteric states: Secondary | ICD-10-CM | POA: Diagnosis not present

## 2023-10-15 DIAGNOSIS — Z1331 Encounter for screening for depression: Secondary | ICD-10-CM | POA: Diagnosis not present

## 2023-10-15 DIAGNOSIS — R6882 Decreased libido: Secondary | ICD-10-CM | POA: Insufficient documentation

## 2023-10-15 DIAGNOSIS — Z01419 Encounter for gynecological examination (general) (routine) without abnormal findings: Secondary | ICD-10-CM | POA: Insufficient documentation

## 2023-10-15 MED ORDER — ESTRADIOL 0.1 MG/GM VA CREA
TOPICAL_CREAM | VAGINAL | 1 refills | Status: AC
Start: 2023-10-15 — End: ?
  Filled 2024-03-19: qty 42.5, 30d supply, fill #0

## 2023-10-15 MED ORDER — COMBIPATCH 0.05-0.25 MG/DAY TD PTTW
1.0000 | MEDICATED_PATCH | TRANSDERMAL | 3 refills | Status: AC
  Filled 2024-03-18 – 2024-04-11 (×3): qty 24, 84d supply, fill #0
  Filled 2024-06-30: qty 24, 84d supply, fill #1

## 2023-10-15 NOTE — Patient Instructions (Signed)

## 2023-10-15 NOTE — Assessment & Plan Note (Addendum)
 Also discussed multifactorial etiologies for low libido. Low normal T, completed by PCP in [facility] tab.  Medical therapies include testosterone placement, Addyi, Vyleesi, wellbutrin.  After discussion, she is not interested in any of those options. Will proceed with HRT, previously on aygestin  for AUB.

## 2023-10-15 NOTE — Assessment & Plan Note (Signed)
 Briefly discussed management of VMS, including use of HRT, SSRI, and veozah. Discussed necessity of risk screening depending on class of treatment.  Patient is interested in HRT. Risk and benefits reviewed. Reviewed risk of breast cancer, DVT, and cardiovascular risk including stroke and MI. 21yr CV risk: 3.7%.  Breast cancer risk: 1%, 8.9% per Gregary Lean model. She is an appropriate candidate as she is perimenopausal or it has been less than 10 years since menopause. Discussed topical estrogen carries a lower risk of DVT and will prescribe progestin for endometrial protection unless patient had had a hysterectomy. All questions answered. RTO in 3 months.

## 2023-10-15 NOTE — Progress Notes (Signed)
 53 y.o. [id number] female with AUB, controlled with daily aygestin , mammoplasty (2023) here for annual exam. Married, vasectomy. Travel agent.  No LMP recorded. (Menstrual status: Oral contraceptives).  Ongoing vulvar itching.  Unimproved with topical steroids prescribed last year. Also notes low libido and vaginal dryness during intercourse. No longer has pleasurable sensation from breast with intercourse, so this is also affecting her libido. Unhappy with mammoplasty result.  Abnormal bleeding: No bleeding at all with Aygestin  Pelvic discharge or pain: None Breast mass, nipple discharge or skin changes : None bilateral mammoplasty scars present  Last PAP:     Component Value Date/Time   DIAGPAP  05/22/2019 1633    - Negative for Intraepithelial Lesions or Malignancy (NILM)   DIAGPAP - Benign reactive/reparative changes 05/22/2019 1633   HPVHIGH Negative 05/22/2019 1633   ADEQPAP  05/22/2019 1633    Satisfactory for evaluation; transformation zone component PRESENT.   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 pylori infection    Hyperlipidemia    Hypertension    Migraines    Obesity    RLQ abdominal pain    Thyroid disease    hypo    Past Surgical History:  Procedure Laterality Date    APPENDECTOMY     DILATION AND CURETTAGE OF UTERUS  10/10/2005   HERNIA REPAIR     LAPAROSCOPY  06/13/1999   REDUCTION MAMMAPLASTY  06/2021   UMBILICAL HERNIA REPAIR      Current Outpatient Medications on File Prior to Visit  Medication Sig Dispense Refill   albuterol (VENTOLIN HFA) 108 (90 Base) MCG/ACT inhaler Inhale into the lungs every 6 (six) hours as needed for wheezing or shortness of breath.     Cholecalciferol (VITAMIN D3) 1.25 MG (50000 UT) TABS Take 2,000 Units by mouth daily.     cyclobenzaprine (FLEXERIL) 10 MG tablet Take by mouth.     EPINEPHrine 0.3 mg/0.3 mL IJ SOAJ injection Inject 0.3 mg into the muscle as needed for anaphylaxis (call 911).     FARXIGA  5 MG TABS tablet Take 1 tablet (5 mg total) by mouth daily before breakfast. To replace Jardiance  30 tablet 6   Ferrous Sulfate (IRON PO) Take 65 mg by mouth once a week.     ibuprofen (ADVIL) 200 MG tablet Take 600 mg by mouth every 6 (six) hours as needed for headache or moderate pain.     levothyroxine  (SYNTHROID ) 50 MCG tablet Take 1 tablet (50 mcg total) by mouth daily before breakfast. 90 tablet  3   loratadine (CLARITIN) 10 MG tablet Take 10 mg by mouth daily.     LORazepam  (ATIVAN ) 0.5 MG tablet Take 0.5 mg by mouth every 4 (four) hours as needed for anxiety.     montelukast (SINGULAIR) 10 MG tablet Take 10 mg by mouth daily.     Multiple Vitamin (MULTIVITAMIN PO) Take by mouth.     omeprazole (PRILOSEC) 20 MG capsule Take 20 mg by mouth daily.     phentermine (ADIPEX-P) 37.5 MG tablet Take 18.75-37.5 mg by mouth every morning.     rosuvastatin  (CRESTOR ) 5 MG tablet Take 1 tablet (5 mg total) by mouth at bedtime. 90 tablet 3   Spacer/Aero-Holding Chambers (AEROCHAMBER PLUS FLO-VU LARGE) MISC See admin instructions.     SYMBICORT 160-4.5 MCG/ACT inhaler as needed.     valsartan-hydrochlorothiazide (DIOVAN-HCT) 160-25 MG tablet Take 1 tablet by mouth daily.     vitamin B-12 (CYANOCOBALAMIN) 1000 MCG tablet Take 1,000  mcg by mouth daily.     No current facility-administered medications on file prior to visit.    Social History   Socioeconomic History   Marital status: Married    Spouse name: Not on file   Number of children: Not on file   Years of education: Not on file   Highest education level: Not on file  Occupational History   Not on file  Tobacco Use   Smoking status: Never   Smokeless tobacco: Never  Vaping Use   Vaping status: Never Used  Substance and Sexual Activity   Alcohol use: No   Drug use: No   Sexual activity: Yes    Birth control/protection: Surgical    Comment: spouse with vasectomy  Other Topics Concern   Not on file  Social History Narrative   Not on file   Social Drivers of Health   Financial Resource Strain: Not on file  Food Insecurity: Not on file  Transportation Needs: Not on file  Physical Activity: Not on file  Stress: Not on file  Social Connections: Unknown (10/25/2021)   [facility]   Social Network    Social Network: Not on file  Intimate Partner Violence: Unknown (09/16/2021)   Received from [employer], [facility]   HITS    Physically Hurt: Not on file    Insult or Talk Down To: Not on file    Threaten Physical Harm: Not on file    Scream or Curse: Not on file    Family History  Problem Relation Age of Onset   Hyperlipidemia Mother    Hypertension Mother    Cancer Father        brain   Hypertension Father    Diabetes Maternal Uncle    Heart disease Maternal Grandmother    Stroke Maternal Grandfather    Diabetes Maternal Grandfather    Cancer Paternal Grandmother        ovarian   Hyperlipidemia Paternal Grandmother    Cancer Paternal Grandfather        prostate   Diabetes Paternal Grandfather    Hyperlipidemia Paternal Grandfather    Colon cancer Neg Hx    Colon polyps Neg Hx    Crohn's disease Neg Hx    Esophageal cancer Neg Hx    Rectal cancer Neg Hx    Stomach cancer Neg Hx    Ulcerative  colitis Neg Hx     Allergies  Allergen Reactions   Other Shortness Of Breath and Swelling  Walnuts and pecans   Keflex [Cephalexin] Hives   Macrolides And Ketolides    Sulfa Antibiotics Nausea And Vomiting   Wellbutrin [Bupropion] Other (See Comments)    Homicidal ideation      PE Today's Vitals   10/15/23 1323  BP: 134/76  Pulse: 89  Temp: 97.9 F (36.6 C)  TempSrc: Oral  SpO2: 98%  Weight: 252 lb (114.3 kg)  Height: 5' 6.5" (1.689 m)   Body mass index is 40.06 kg/m.  Physical Exam Vitals reviewed. Exam conducted with a chaperone present.  Constitutional:      General: She is not in acute distress.    Appearance: Normal appearance.  HENT:     Head: Normocephalic and atraumatic.     Nose: Nose normal.  Eyes:     Extraocular Movements: Extraocular movements intact.     Conjunctiva/sclera: Conjunctivae normal.  Neck:     Thyroid: No thyroid mass, thyromegaly or thyroid tenderness.  Pulmonary:     Effort: Pulmonary effort is normal.  Chest:     Chest wall: No mass or tenderness.  Breasts:    Right: Normal. No swelling, mass, nipple discharge, skin change or tenderness.     Left: Normal. No swelling, mass, nipple discharge, skin change or tenderness.  Abdominal:     General: There is no distension.     Palpations: Abdomen is soft.     Tenderness: There is no abdominal tenderness.  Genitourinary:    General: Normal vulva.     Exam position: Lithotomy position.     Urethra: No prolapse.     Vagina: Normal. No vaginal discharge or bleeding.     Cervix: Normal. No lesion.     Uterus: Normal. Not enlarged and not tender.      Adnexa: Right adnexa normal and left adnexa normal.  Musculoskeletal:        General: Normal range of motion.     Cervical back: Normal range of motion.  Lymphadenopathy:     Upper Body:     Right upper body: No axillary adenopathy.     Left upper body: No axillary adenopathy.     Lower Body: No right inguinal adenopathy. No left  inguinal adenopathy.  Skin:    General: Skin is warm and dry.  Neurological:     General: No focal deficit present.     Mental Status: She is alert.  Psychiatric:        Mood and Affect: Mood normal.        Behavior: Behavior normal.     Assessment and Plan:        Well woman exam with routine gynecological exam Assessment & Plan: Cervical cancer screening performed according to ASCCP guidelines. Encouraged annual mammogram screening Colonoscopy never, cologuard screening negative DXA N/A Labs and immunizations with her primary Encouraged safe sexual practices as indicated Encouraged healthy lifestyle practices with diet and exercise For patients under 50-70yo, I recommend 1200mg  calcium  daily and 600IU of vitamin D daily.    Cervical cancer screening -     Cytology - PAP  Negative depression screening  Genitourinary syndrome of menopause Assessment & Plan: Reviewed safety profile of low dose vaginal estrogen, however reviewed that higher doses have been associated with DVT, breast and uterine cancer.    Orders: -     Estradiol; Apply 1/2 gram to vulva nightly for 2 weeks then decrease to 1/2 gram to vulva two nights a week. Do not use applicator.  Dispense: 42.5 g; Refill: 1  Perimenopause Assessment & Plan: Briefly discussed management of VMS, including use of HRT, SSRI, and veozah. Discussed necessity of risk screening depending on class of treatment.  Patient is interested in HRT. Risk and benefits reviewed. Reviewed risk of breast cancer, DVT, and cardiovascular risk including stroke and MI. 39yr CV risk: 3.7%.  Breast cancer risk: 1%, 8.9% per Gregary Lean model. She is an appropriate candidate as she is perimenopausal or it has been less than 10 years since menopause. Discussed topical estrogen carries a lower risk of DVT and will prescribe progestin for endometrial protection unless patient had had a hysterectomy. All questions answered. RTO in 3 months.   Orders: -      CombiPatch; Place 1 patch onto the skin 2 (two) times a week.  Dispense: 24 patch; Refill: 3  Low libido Assessment & Plan: Also discussed multifactorial etiologies for low libido. Low normal T, completed by PCP in [facility] tab.  Medical therapies include testosterone placement, Addyi, Vyleesi, wellbutrin.  After discussion, she is not interested in any of those options. Will proceed with HRT, previously on aygestin  for AUB.    Romaine Closs, MD

## 2023-10-15 NOTE — Assessment & Plan Note (Signed)
Reviewed safety profile of low dose vaginal estrogen, however reviewed that higher doses have been associated with DVT, breast and uterine cancer.

## 2023-10-15 NOTE — Assessment & Plan Note (Signed)
 Cervical cancer screening performed according to ASCCP guidelines. Encouraged annual mammogram screening Colonoscopy never, cologuard screening negative DXA N/A Labs and immunizations with her primary Encouraged safe sexual practices as indicated Encouraged healthy lifestyle practices with diet and exercise For patients under 50-52yo, I recommend 1200mg  calcium  daily and 600IU of vitamin D daily.

## 2023-10-16 NOTE — Addendum Note (Signed)
 Addended by: Cleone Dad V on: 10/16/2023 12:45 PM   Modules accepted: Level of Service

## 2023-10-18 LAB — CYTOLOGY - PAP
Comment: NEGATIVE
Diagnosis: UNDETERMINED — AB
High risk HPV: NEGATIVE

## 2023-10-19 ENCOUNTER — Encounter: Payer: Self-pay | Admitting: Obstetrics and Gynecology

## 2023-10-31 ENCOUNTER — Encounter: Payer: Self-pay | Admitting: Family Medicine

## 2023-10-31 ENCOUNTER — Other Ambulatory Visit (HOSPITAL_COMMUNITY): Payer: Self-pay

## 2023-10-31 ENCOUNTER — Ambulatory Visit: Admitting: Family Medicine

## 2023-10-31 VITALS — BP 130/80 | HR 94 | Temp 99.0°F | Ht 66.5 in | Wt 253.0 lb

## 2023-10-31 DIAGNOSIS — J301 Allergic rhinitis due to pollen: Secondary | ICD-10-CM

## 2023-10-31 DIAGNOSIS — Z6841 Body Mass Index (BMI) 40.0 and over, adult: Secondary | ICD-10-CM

## 2023-10-31 DIAGNOSIS — F40243 Fear of flying: Secondary | ICD-10-CM | POA: Insufficient documentation

## 2023-10-31 DIAGNOSIS — K219 Gastro-esophageal reflux disease without esophagitis: Secondary | ICD-10-CM | POA: Diagnosis not present

## 2023-10-31 DIAGNOSIS — E559 Vitamin D deficiency, unspecified: Secondary | ICD-10-CM

## 2023-10-31 DIAGNOSIS — J4530 Mild persistent asthma, uncomplicated: Secondary | ICD-10-CM | POA: Diagnosis not present

## 2023-10-31 DIAGNOSIS — E7800 Pure hypercholesterolemia, unspecified: Secondary | ICD-10-CM

## 2023-10-31 DIAGNOSIS — I10 Essential (primary) hypertension: Secondary | ICD-10-CM

## 2023-10-31 DIAGNOSIS — E119 Type 2 diabetes mellitus without complications: Secondary | ICD-10-CM

## 2023-10-31 DIAGNOSIS — Z7689 Persons encountering health services in other specified circumstances: Secondary | ICD-10-CM

## 2023-10-31 MED ORDER — MONTELUKAST SODIUM 10 MG PO TABS: 10.0000 mg | ORAL_TABLET | Freq: Every day | ORAL | 2 refills | Status: DC

## 2023-10-31 MED ORDER — ALBUTEROL SULFATE HFA 108 (90 BASE) MCG/ACT IN AERS: 2.0000 | INHALATION_SPRAY | Freq: Four times a day (QID) | RESPIRATORY_TRACT | 1 refills | Status: AC | PRN

## 2023-10-31 MED ORDER — VALSARTAN-HYDROCHLOROTHIAZIDE 160-25 MG PO TABS: 1.0000 | ORAL_TABLET | Freq: Every day | ORAL | 0 refills | Status: DC

## 2023-10-31 NOTE — Progress Notes (Signed)
 New Patient Office Visit  Subjective   Patient ID: Yolanda Mendez, female    DOB: [birth year]  Age: 53 y.o. MRN: [id number]  CC:  Chief Complaint  Patient presents with   Establish Care    HPI Yolanda Mendez presents to establish care with new provider.   Patients previous primary care provider: Rayma Calandra MD Family Medicine  Specialist: [facility] Dr. Glendon Landsberg [facility]  [facility] with Dr. Cleone Dad   Asthma: Chronic. Patient is prescribed Albuterol Inhaler, 2 puffs every 6 hours as needed. Last used: Unknown. Out of date.   Anxiety: Patient is prescribed Lorazepam  0.5mg  every 4 hours as needed for anxiety. She takes it only when flying. Patient scored 0 on GAD-7 and PHQ-9.  Seasonal allergies/Asthma: Patient is prescribed Loratadine 10mg  and Montelukast 10mg  tablet. Effective.   GERD: Patient is prescribed Omeprazole 20mg  daily. Effective.   Weight Loss: Patient is taking Phentermine 37.5mg  tablet daily. Patient takes it 3 months and then comes off medication. Just completed first month. She reports it really curves her appetite. Tried Ozempic and Mounjaro.   Hyperlipidemia: Patient is prescribed Rosuvastatin  5mg  daily. Has no complaints with taking medication.   HTN: Patient is prescribed Valsartan/HCTZ 160-25mg  daily. Blood pressure controlled today.   Outpatient Encounter Medications as of 10/31/2023  Medication Sig   Cholecalciferol (VITAMIN D3) 1.25 MG (50000 UT) TABS Take 2,000 Units by mouth daily.   EPINEPHrine 0.3 mg/0.3 mL IJ SOAJ injection Inject 0.3 mg into the muscle as needed for anaphylaxis (call 911).   estradiol -norethindrone  (COMBIPATCH ) 0.05-0.25 MG/DAY Place 1 patch onto the skin 2 (two) times a week.   FARXIGA  5 MG TABS tablet Take 1 tablet (5 mg total) by mouth daily before breakfast. To replace Jardiance    Ferrous Sulfate (IRON PO) Take 65 mg by mouth once a week.   ibuprofen (ADVIL) 200 MG  tablet Take 600 mg by mouth every 6 (six) hours as needed for headache or moderate pain.   levothyroxine  (SYNTHROID ) 50 MCG tablet Take 1 tablet (50 mcg total) by mouth daily before breakfast.   loratadine (CLARITIN) 10 MG tablet Take 10 mg by mouth daily.   LORazepam  (ATIVAN ) 0.5 MG tablet Take 0.5 mg by mouth every 4 (four) hours as needed for anxiety.   Multiple Vitamin (MULTIVITAMIN PO) Take by mouth.   omeprazole (PRILOSEC) 20 MG capsule Take 20 mg by mouth daily.   phentermine (ADIPEX-P) 37.5 MG tablet Take 18.75-37.5 mg by mouth every morning.   rosuvastatin  (CRESTOR ) 5 MG tablet Take 1 tablet (5 mg total) by mouth at bedtime.   vitamin B-12 (CYANOCOBALAMIN) 1000 MCG tablet Take 1,000 mcg by mouth daily.   [DISCONTINUED] albuterol (VENTOLIN HFA) 108 (90 Base) MCG/ACT inhaler Inhale into the lungs every 6 (six) hours as needed for wheezing or shortness of breath.   [DISCONTINUED] montelukast (SINGULAIR) 10 MG tablet Take 10 mg by mouth daily.   [DISCONTINUED] SYMBICORT 160-4.5 MCG/ACT inhaler as needed.   [DISCONTINUED] valsartan-hydrochlorothiazide (DIOVAN-HCT) 160-25 MG tablet Take 1 tablet by mouth daily.   albuterol (VENTOLIN HFA) 108 (90 Base) MCG/ACT inhaler Inhale 2 puffs into the lungs every 6 (six) hours as needed for wheezing or shortness of breath (cough).   estradiol  (ESTRACE  VAGINAL) 0.1 MG/GM vaginal cream Apply 1/2 gram to vulva nightly for 2 weeks then decrease to 1/2 gram to vulva two nights a week. Do not use applicator.   montelukast (SINGULAIR) 10 MG tablet Take 1 tablet (10  mg total) by mouth daily for 90 doses.   valsartan-hydrochlorothiazide (DIOVAN-HCT) 160-25 MG tablet Take 1 tablet by mouth daily.   [DISCONTINUED] cyclobenzaprine (FLEXERIL) 10 MG tablet Take by mouth.   [DISCONTINUED] Spacer/Aero-Holding Chambers (AEROCHAMBER PLUS FLO-VU LARGE) MISC See admin instructions.   No facility-administered encounter medications on file as of 10/31/2023.    Past Medical  History:  Diagnosis Date   Allergy    seasonal   Anemia    Asthma    Cervicitis    Diabetes mellitus without complication (HCC)    GERD (gastroesophageal reflux disease)    H. pylori infection    Hyperlipidemia    Hypertension    Migraines    Obesity    RLQ abdominal pain    Thyroid disease    hypo    Past Surgical History:  Procedure Laterality Date   APPENDECTOMY     DILATION AND CURETTAGE OF UTERUS  10/10/2005   HERNIA REPAIR     LAPAROSCOPY  06/13/1999   REDUCTION MAMMAPLASTY  06/2021   UMBILICAL HERNIA REPAIR      Family History  Problem Relation Age of Onset   Hyperlipidemia Mother    Hypertension Mother    Cancer Father        brain   Hypertension Father    Hyperlipidemia Sister    Hypertension Sister    Hyperlipidemia Brother    Hypertension Brother    Diabetes Maternal Uncle    Heart disease Maternal Grandmother    Stroke Maternal Grandfather    Diabetes Maternal Grandfather    Cancer Paternal Grandmother        ovarian   Hyperlipidemia Paternal Grandmother    Cancer Paternal Grandfather        prostate   Diabetes Paternal Grandfather    Hyperlipidemia Paternal Grandfather    Colon cancer Neg Hx    Colon polyps Neg Hx    Crohn's disease Neg Hx    Esophageal cancer Neg Hx    Rectal cancer Neg Hx    Stomach cancer Neg Hx    Ulcerative colitis Neg Hx     Social History   Socioeconomic History   Marital status: Married    Spouse name: Not on file   Number of children: 2   Years of education: Not on file   Highest education level: Master's degree (e.g., MA, MS, MEng, MEd, MSW, MBA)  Occupational History   Not on file  Tobacco Use   Smoking status: Never   Smokeless tobacco: Never  Vaping Use   Vaping status: Never Used  Substance and Sexual Activity   Alcohol use: No   Drug use: No   Sexual activity: Yes    Birth control/protection: Surgical    Comment: spouse with vasectomy  Other Topics Concern   Not on file  Social History  Narrative   Not on file   Social Drivers of Health   Financial Resource Strain: Low Risk  (10/31/2023)   Overall Financial Resource Strain (CARDIA)    Difficulty of Paying Living Expenses: Not hard at all  Food Insecurity: No Food Insecurity (10/31/2023)   Hunger Vital Sign    Worried About Running Out of Food in the Last Year: Never true    Ran Out of Food in the Last Year: Never true  Transportation Needs: No Transportation Needs (10/31/2023)   PRAPARE - Administrator, Civil Service (Medical): No    Lack of Transportation (Non-Medical): No  Physical Activity: Inactive (10/31/2023)  Exercise Vital Sign    Days of Exercise per Week: 0 days    Minutes of Exercise per Session: 0 min  Stress: No Stress Concern Present (10/31/2023)   [employer] of Occupational Health - Occupational Stress Questionnaire    Feeling of Stress : Not at all  Social Connections: Moderately Isolated (10/31/2023)   Social Connection and Isolation Panel [NHANES]    Frequency of Communication with Friends and Family: More than three times a week    Frequency of Social Gatherings with Friends and Family: Once a week    Attends Religious Services: More than 4 times per year    Active Member of [employer] or Organizations: No    Attends Banker Meetings: Never    Marital Status: Widowed  Intimate Partner Violence: Not At Risk (10/31/2023)   Humiliation, Afraid, Rape, and Kick questionnaire    Fear of Current or Ex-Partner: No    Emotionally Abused: No    Physically Abused: No    Sexually Abused: No    ROS See HPI above    Objective  BP 130/80   Pulse 94   Temp 99 F (37.2 C) (Oral)   Ht 5' 6.5" (1.689 m)   Wt 253 lb (114.8 kg)   LMP  (LMP Unknown)   SpO2 95%   BMI 40.22 kg/m   Physical Exam Vitals reviewed.  Constitutional:      General: She is not in acute distress.    Appearance: Normal appearance. She is obese. She is not ill-appearing, toxic-appearing or diaphoretic.   HENT:     Head: Normocephalic and atraumatic.  Eyes:     General:        Right eye: No discharge.        Left eye: No discharge.     Conjunctiva/sclera: Conjunctivae normal.  Cardiovascular:     Rate and Rhythm: Normal rate and regular rhythm.     Heart sounds: Normal heart sounds. No murmur heard.    No friction rub. No gallop.  Pulmonary:     Effort: Pulmonary effort is normal. No respiratory distress.     Breath sounds: Normal breath sounds.  Musculoskeletal:        General: Normal range of motion.  Skin:    General: Skin is warm and dry.  Neurological:     General: No focal deficit present.     Mental Status: She is alert and oriented to person, place, and time. Mental status is at baseline.  Psychiatric:        Mood and Affect: Mood normal.        Behavior: Behavior normal.        Thought Content: Thought content normal.        Judgment: Judgment normal.      Assessment & Plan:  Mild persistent asthma without complication Assessment & Plan: Stable. Continue and refilled Albuterol Inhaler, 2 puffs every 6 hours as needed for wheezing, SHOB, and cough.   Orders: -     Albuterol Sulfate HFA; Inhale 2 puffs into the lungs every 6 (six) hours as needed for wheezing or shortness of breath (cough).  Dispense: 18 g; Refill: 1  Hypertension, unspecified type Assessment & Plan: Stable. Continue Valsartan/HCTZ 160-25mg  daily. Refilled medication. Ordered CMP to assess kidney function and potassium level.   Orders: -     Comprehensive metabolic panel with GFR; Future -     Valsartan-hydroCHLOROthiazide; Take 1 tablet by mouth daily.  Dispense: 90 tablet; Refill: 0  Seasonal allergic rhinitis due to pollen Assessment & Plan: Stable. Refilled Montelukast 10mg  daily.   Orders: -     Montelukast Sodium; Take 1 tablet (10 mg total) by mouth daily for 90 doses.  Dispense: 30 tablet; Refill: 2  Gastroesophageal reflux disease without esophagitis Assessment & Plan: Stable.  Continue with Omeprazole 20mg  daily.    Type 2 diabetes mellitus without complication, without long-term current use of insulin (HCC) Assessment & Plan: Managed by endocrinology. Ordered CMP and microalbumin/creatinine urine ratio to assess kidney function.   Orders: -     Comprehensive metabolic panel with GFR; Future -     Microalbumin / creatinine urine ratio; Future  High cholesterol -     Comprehensive metabolic panel with GFR; Future -     Lipid panel; Future  Body mass index 40.0-44.9, adult [facility]) Assessment & Plan: Stable. Continue taking Phentermine 37.5mg  daily. PDMP review on 09/11/2023. Been on it only 1 month, has only 2 more months.    Vitamin D deficiency -     VITAMIN D 25 Hydroxy (Vit-D Deficiency, Fractures); Future  Anxiety with flying Assessment & Plan: Stable. Continue Lorazepam  0.5mg  every 4 hours as needed for anxiety Ordered urine drug screen for management of Lorazepam . This will need to be completed periodically. Signed controlled substance agreement that will need to be renewed yearly. No refilled needed. PDMP reviewed. Last refilled 11/13/2022.   Orders: -     DRUG MONITOR, PANEL 1, SCREEN, URINE  Encounter to establish care   1.Review health maintenance:  -Cologuard: Fall 2024, negative with previous PCP-request records  -Covid booster: Obtain at your local pharmacy -Tdap vaccine: 2023, request records PCP  -Hep C and HIV screening: Declines  -Ophthalmology: 06/2022; Needs appt  -PNA vaccine: request records PCP -Zoster vaccine: request records PCP  2.Ordered labs/urine (CMP, Vitamin D, Lipid Panel, Vitamin D, and Microalbumin/creatinine urine ratio). Please make a lab appointment and please be fasting.  3.Refilled Albuterol Inhaler, Valsartan-HCTZ, and Montelukast. Ordered urine drug screen for management of Lorazepam . This will need to be completed periodically. Signed controlled substance agreement that will need to be renewed yearly.   -Continue all other prescribed medications and follow up with specialty appointments.  -Follow up in 3 months.  Return in about 3 months (around 01/31/2024) for chronic management: lab appt in next 2 weeks .   Antoinne Spadaccini, NP

## 2023-10-31 NOTE — Assessment & Plan Note (Signed)
 Managed by endocrinology. Ordered CMP and microalbumin/creatinine urine ratio to assess kidney function.

## 2023-10-31 NOTE — Assessment & Plan Note (Signed)
Stable. Continue with Omeprazole 20mg  daily.

## 2023-10-31 NOTE — Assessment & Plan Note (Signed)
 Stable. Refilled Montelukast 10mg  daily.

## 2023-10-31 NOTE — Assessment & Plan Note (Signed)
 Stable. Continue and refilled Albuterol Inhaler, 2 puffs every 6 hours as needed for wheezing, SHOB, and cough.

## 2023-10-31 NOTE — Patient Instructions (Addendum)
-  It was nice to meet you and look forward to providing care to you. -Recommend to obtain covid booster vaccines at your local pharmacy. -Recommend to make an appointment for an eye exam with ophthalmology.  -Ordered labs/urine. Please make a lab appointment and please be fasting.  -Refilled Albuterol Inhaler, Valsartan-HCTZ, and Montelukast. -Ordered urine drug screen for management of Lorazepam . This will need to be completed periodically. Signed controlled substance agreement that will need to be renewed yearly.  -Continue all other prescribed medications and follow up with specialty appointments.  -Follow up in 3 months.

## 2023-10-31 NOTE — Assessment & Plan Note (Addendum)
 Stable. Continue taking Phentermine 37.5mg  daily. PDMP review on 09/11/2023. Been on it only 1 month, has only 2 more months.

## 2023-10-31 NOTE — Assessment & Plan Note (Signed)
 Stable. Continue Lorazepam  0.5mg  every 4 hours as needed for anxiety Ordered urine drug screen for management of Lorazepam . This will need to be completed periodically. Signed controlled substance agreement that will need to be renewed yearly. No refilled needed. PDMP reviewed. Last refilled 11/13/2022.

## 2023-10-31 NOTE — Assessment & Plan Note (Signed)
 Stable. Continue Valsartan/HCTZ 160-25mg  daily. Refilled medication. Ordered CMP to assess kidney function and potassium level.

## 2023-11-01 ENCOUNTER — Ambulatory Visit: Payer: Self-pay | Admitting: Family Medicine

## 2023-11-01 LAB — DRUG MONITOR, PANEL 1, SCREEN, URINE
Amphetamines: NEGATIVE ng/mL (ref <500)
Barbiturates: NEGATIVE ng/mL (ref <300)
Benzodiazepines: NEGATIVE ng/mL (ref <100)
Cocaine Metabolite: NEGATIVE ng/mL (ref <150)
Creatinine: 51.4 mg/dL (ref >=20.0)
Marijuana Metabolite: NEGATIVE ng/mL (ref <20)
Methadone Metabolite: NEGATIVE ng/mL (ref <100)
Opiates: NEGATIVE ng/mL (ref <100)
Oxidant: NEGATIVE ug/mL (ref <200)
Oxycodone: NEGATIVE ng/mL (ref <100)
Phencyclidine: NEGATIVE ng/mL (ref <25)
pH: 7.4 (ref 4.5–9.0)

## 2023-11-01 LAB — DM TEMPLATE

## 2023-11-02 ENCOUNTER — Telehealth: Payer: Self-pay

## 2023-11-02 ENCOUNTER — Ambulatory Visit: Admitting: Family Medicine

## 2023-11-02 ENCOUNTER — Other Ambulatory Visit (HOSPITAL_COMMUNITY): Payer: Self-pay

## 2023-11-02 NOTE — Telephone Encounter (Signed)
 Pharmacy Patient Advocate Encounter   Received notification from CoverMyMeds that prior authorization for Ventolin inhaler is required/requested.   Insurance verification completed.   The patient is insured through [employer] .   Per test claim: patient has plan benefit exclusion

## 2023-11-08 ENCOUNTER — Ambulatory Visit: Admitting: Family Medicine

## 2023-11-08 ENCOUNTER — Other Ambulatory Visit (INDEPENDENT_AMBULATORY_CARE_PROVIDER_SITE_OTHER)

## 2023-11-08 DIAGNOSIS — E119 Type 2 diabetes mellitus without complications: Secondary | ICD-10-CM

## 2023-11-08 DIAGNOSIS — E7800 Pure hypercholesterolemia, unspecified: Secondary | ICD-10-CM

## 2023-11-08 DIAGNOSIS — I10 Essential (primary) hypertension: Secondary | ICD-10-CM

## 2023-11-08 DIAGNOSIS — E559 Vitamin D deficiency, unspecified: Secondary | ICD-10-CM | POA: Diagnosis not present

## 2023-11-08 LAB — COMPREHENSIVE METABOLIC PANEL WITH GFR
ALT: 32 U/L (ref 0–35)
AST: 26 U/L (ref 0–37)
Albumin: 4.4 g/dL (ref 3.5–5.2)
Alkaline Phosphatase: 61 U/L (ref 39–117)
BUN: 10 mg/dL (ref 6–23)
CO2: 30 meq/L (ref 19–32)
Calcium: 9.2 mg/dL (ref 8.4–10.5)
Chloride: 100 meq/L (ref 96–112)
Creatinine, Ser: 0.75 mg/dL (ref 0.40–1.20)
GFR: 91.95 mL/min (ref >60.00)
Glucose, Bld: 96 mg/dL (ref 70–99)
Potassium: 3.4 meq/L — ABNORMAL LOW (ref 3.5–5.1)
Sodium: 138 meq/L (ref 135–145)
Total Bilirubin: 0.3 mg/dL (ref 0.2–1.2)
Total Protein: 7.3 g/dL (ref 6.0–8.3)

## 2023-11-08 LAB — VITAMIN D 25 HYDROXY (VIT D DEFICIENCY, FRACTURES): VITD: 68.02 ng/mL (ref 30.00–100.00)

## 2023-11-08 LAB — LIPID PANEL
Cholesterol: 133 mg/dL (ref 0–200)
HDL: 44.3 mg/dL (ref >39.00)
LDL Cholesterol: 67 mg/dL (ref 0–99)
NonHDL: 88.89
Total CHOL/HDL Ratio: 3
Triglycerides: 111 mg/dL (ref 0.0–149.0)
VLDL: 22.2 mg/dL (ref 0.0–40.0)

## 2023-11-19 ENCOUNTER — Other Ambulatory Visit: Payer: Self-pay | Admitting: Family Medicine

## 2023-11-19 DIAGNOSIS — J301 Allergic rhinitis due to pollen: Secondary | ICD-10-CM

## 2023-11-20 ENCOUNTER — Other Ambulatory Visit (HOSPITAL_COMMUNITY): Payer: Self-pay

## 2023-11-29 ENCOUNTER — Encounter: Payer: Self-pay | Admitting: Internal Medicine

## 2023-11-29 ENCOUNTER — Other Ambulatory Visit: Payer: Self-pay | Admitting: Internal Medicine

## 2023-11-29 ENCOUNTER — Encounter: Payer: Self-pay | Admitting: Family Medicine

## 2023-11-29 MED ORDER — TRULICITY 0.75 MG/0.5ML ~~LOC~~ SOAJ: 0.7500 mg | SUBCUTANEOUS | 3 refills | Status: DC

## 2023-12-04 ENCOUNTER — Other Ambulatory Visit (HOSPITAL_COMMUNITY): Payer: Self-pay

## 2023-12-04 ENCOUNTER — Telehealth: Payer: Self-pay | Admitting: Pharmacy Technician

## 2023-12-04 NOTE — Telephone Encounter (Signed)
 Pharmacy Patient Advocate Encounter   Received notification from CoverMyMeds that prior authorization for Trulicity  0.75MG /0.5ML auto-injectors is required/requested.   Insurance verification completed.   The patient is insured through [employer] .   Per test claim: The current 84 day co-pay is, $25.00.  No PA needed at this time. This test claim was processed through [facility]- copay amounts may vary at other pharmacies due to pharmacy/plan contracts, or as the patient moves through the different stages of their insurance plan.

## 2023-12-13 ENCOUNTER — Ambulatory Visit: Admitting: Family Medicine

## 2023-12-13 ENCOUNTER — Encounter: Payer: Self-pay | Admitting: Family Medicine

## 2023-12-13 VITALS — BP 136/84 | HR 82 | Temp 98.1°F | Ht 66.5 in | Wt 259.0 lb

## 2023-12-13 DIAGNOSIS — I10 Essential (primary) hypertension: Secondary | ICD-10-CM | POA: Diagnosis not present

## 2023-12-13 DIAGNOSIS — Z79899 Other long term (current) drug therapy: Secondary | ICD-10-CM

## 2023-12-13 DIAGNOSIS — Z6841 Body Mass Index (BMI) 40.0 and over, adult: Secondary | ICD-10-CM

## 2023-12-13 DIAGNOSIS — E66813 Obesity, class 3: Secondary | ICD-10-CM

## 2023-12-13 MED ORDER — PHENTERMINE HCL 37.5 MG PO TABS: 37.5000 mg | ORAL_TABLET | Freq: Every day | ORAL | 0 refills | Status: DC

## 2023-12-13 NOTE — Progress Notes (Signed)
 Established Patient Office Visit   Subjective:  Patient ID: Yolanda Mendez, female    DOB: June 05, 1971  Age: 53 y.o. MRN: [id number]  Chief Complaint  Patient presents with   Medical Management of Chronic Issues    Weight  loss     HPI Obesity: Patient has previously been on Phentermine with her previous PCP, Dr. Almarie Scala. She initially started medication at 15mg  daily. Then, in September 2024, medication was increased to 37.5mg  daily. Patient reports she was instructed to take 1/2 tablet in the morning and 1/2 at lunchtime. She found that if she took the 1/2 tablet at lunch time, she could not sleep. She reports medication helped curve her appetite. She was being prescribed medication for 3 months, then a 3 week break.   Patient does have a history of HTN, taking Valsartan -HCTZ 160/25mg  daily. Today's BP is highest she reports having. Denies ever having chest pain, SHOB, or palpitations while taking medication.  ROS See HPI above     Objective:   BP 136/84   Pulse 82   Temp 98.1 F (36.7 C) (Oral)   Ht 5' 6.5 (1.689 m)   Wt 259 lb (117.5 kg)   SpO2 95%   BMI 41.18 kg/m  Wt Readings from Last 3 Encounters:  12/13/23 259 lb (117.5 kg)  10/31/23 253 lb (114.8 kg)  10/15/23 252 lb (114.3 kg)   BP Readings from Last 3 Encounters:  12/13/23 136/84  10/31/23 130/80  10/15/23 134/76     Physical Exam Vitals reviewed.  Constitutional:      General: She is not in acute distress.    Appearance: Normal appearance. She is obese. She is not ill-appearing, toxic-appearing or diaphoretic.  HENT:     Head: Normocephalic and atraumatic.  Eyes:     General:        Right eye: No discharge.        Left eye: No discharge.     Conjunctiva/sclera: Conjunctivae normal.  Cardiovascular:     Rate and Rhythm: Normal rate and regular rhythm.     Heart sounds: Normal heart sounds. No murmur heard.    No friction rub. No gallop.  Pulmonary:     Effort: Pulmonary effort is  normal. No respiratory distress.     Breath sounds: Normal breath sounds.  Musculoskeletal:        General: Normal range of motion.  Skin:    General: Skin is warm and dry.  Neurological:     General: No focal deficit present.     Mental Status: She is alert and oriented to person, place, and time. Mental status is at baseline.  Psychiatric:        Mood and Affect: Mood normal.        Behavior: Behavior normal.        Thought Content: Thought content normal.        Judgment: Judgment normal.    EKG ordered to assess rhythm with starting Phentermine with a history of HTN. Normal sinus rhythm, HR 71. No significant changes based on previous EKG of 04/15/2020.     Assessment & Plan:  Class 3 severe obesity due to excess calories with serious comorbidity and body mass index (BMI) of 40.0 to 44.9 in adult -     Phentermine HCl; Take 1 tablet (37.5 mg total) by mouth daily before breakfast.  Dispense: 30 tablet; Refill: 0  Medication management -     EKG  Hypertension, unspecified type -  EKG  -EKG completed prior to starting Phentermine with having hypertension. Normal EKG.  -Discussed about Phentermine-potential side effects of the medication, and concerns with history of hypertension. Prescribed Phentermine 37.5mg  tablet, take 1 tablet every morning.  -Advised to stop medication immediately if you develop chest pain, shortness of breath, or palpitations. Call 911 or go directly to the closes emergency department.  -Will prescribe medication for 3 months, then stop medication for 3 months. A rotating schedule with the medication.  -Signed controlled substance contract for Phentermine. UDS was completed on 10/31/2023 for another controlled substances with no concerns.  -Follow up in 1 month to assess weight loss and make sure blood pressure is stable with being on medication.   Return in about 1 month (around 01/13/2024) for follow-up.   Michaelpaul Apo, NP

## 2023-12-13 NOTE — Patient Instructions (Addendum)
-  It was nice to you see you today.  -EKG completed prior to starting Phentermine with having hypertension. Normal EKG.  -Discussed about Phentermine-potential side effects of the medication, and concerns with history of hypertension. Prescribed Phentermine 37.5mg  tablet, take 1 tablet every morning.  -Advised to stop medication immediately if you develop chest pain, shortness of breath, or palpitations. Call 911 or go directly to the closes emergency department.  -Will prescribe medication for 3 months, then stop medication for 3 months. A rotating schedule with the medication.  -Signed controlled substance contract for Phentermine.  -Follow up in 1 month to assess weight loss and make sure blood pressure is stable with being on medication.

## 2024-01-07 ENCOUNTER — Other Ambulatory Visit: Payer: Self-pay | Admitting: Family Medicine

## 2024-01-07 DIAGNOSIS — Z1231 Encounter for screening mammogram for malignant neoplasm of breast: Secondary | ICD-10-CM

## 2024-01-11 ENCOUNTER — Ambulatory Visit: Admitting: Family Medicine

## 2024-01-11 ENCOUNTER — Encounter: Payer: Self-pay | Admitting: Family Medicine

## 2024-01-11 VITALS — BP 128/84 | HR 89 | Temp 98.7°F | Wt 256.0 lb

## 2024-01-11 DIAGNOSIS — Z6841 Body Mass Index (BMI) 40.0 and over, adult: Secondary | ICD-10-CM | POA: Diagnosis not present

## 2024-01-11 DIAGNOSIS — Z1283 Encounter for screening for malignant neoplasm of skin: Secondary | ICD-10-CM | POA: Diagnosis not present

## 2024-01-11 DIAGNOSIS — M79641 Pain in right hand: Secondary | ICD-10-CM | POA: Diagnosis not present

## 2024-01-11 DIAGNOSIS — E66813 Obesity, class 3: Secondary | ICD-10-CM

## 2024-01-11 MED ORDER — PHENTERMINE HCL 37.5 MG PO TABS: 37.5000 mg | ORAL_TABLET | Freq: Every day | ORAL | 0 refills | Status: DC

## 2024-01-11 NOTE — Patient Instructions (Addendum)
-  It was great to see you today.  -Good job with losing weight.  -Refilled Phentermine  37.5 mg tablet daily for weight loss.  -Recommend to try either Ibuprofen 600-800mg  every 8 hours for 5 days or Naproxen 500mg  every 12 hours for 5 days to see if this helps with the inflammation. Please eat something when taking medication to not cause irritation to stomach.  -Also, provided some general hand exercises to try.  -If not improved, send a message through MyChart.  -Placed a referral to dermatology for skin cancer screening. Please call the office or send a MyChart message if you do not receive a phone call or a MyChart message about appointment in 2 weeks.  -Follow up in 2 months for a physical.

## 2024-01-11 NOTE — Progress Notes (Signed)
 Established Patient Office Visit   Subjective:  Patient ID: Yolanda Mendez, female    DOB: [birth year]  Age: 53 y.o. MRN: [id number]  No chief complaint on file.   HPI Obesity: On previous appointment, started Phentermine  37.5mg  for three months. She was previously on medication, but at a lower dose. Also, has a history of HTN and concerned about increase in blood pressure with taking medication. Patient denies CP, SHOB, or palpitations. She reports her blood pressure has been in the 120s and remains in the 80s here in office.   Patient is complaining of right hand pain, close to the pinky. Started about 3 weeks ago. Does not notice pain usually during the day, but notices at night time. She is unable to make a fist without making a passive ROM to make a fist. She has tried Ibuprofen or ASA, but not really helped. She has started wearing compression gloves that seems to not relief and changed computer set up at home. Denies any injury.  ROS See HPI above     Objective:   BP 128/84   Pulse 89   Temp 98.7 F (37.1 C) (Oral)   Wt 256 lb (116.1 kg)   SpO2 99%   BMI 40.70 kg/m  BP Readings from Last 3 Encounters:  01/11/24 128/84  12/13/23 136/84  10/31/23 130/80   Wt Readings from Last 3 Encounters:  01/11/24 256 lb (116.1 kg)  12/13/23 259 lb (117.5 kg)  10/31/23 253 lb (114.8 kg)       Physical Exam Vitals reviewed.  Constitutional:      General: She is not in acute distress.    Appearance: Normal appearance. She is obese. She is not ill-appearing, toxic-appearing or diaphoretic.  HENT:     Head: Normocephalic and atraumatic.  Eyes:     General:        Right eye: No discharge.        Left eye: No discharge.     Conjunctiva/sclera: Conjunctivae normal.  Cardiovascular:     Rate and Rhythm: Normal rate.  Pulmonary:     Effort: Pulmonary effort is normal. No respiratory distress.  Musculoskeletal:     Right hand: Tenderness (Lateral hand closes to pinky  finger) present. No swelling or deformity. Normal range of motion.  Skin:    General: Skin is warm and dry.  Neurological:     General: No focal deficit present.     Mental Status: She is alert and oriented to person, place, and time. Mental status is at baseline.  Psychiatric:        Mood and Affect: Mood normal.        Behavior: Behavior normal.        Thought Content: Thought content normal.        Judgment: Judgment normal.      Assessment & Plan:  Screening for skin cancer -     Ambulatory referral to Dermatology  Class 3 severe obesity due to excess calories with serious comorbidity and body mass index (BMI) of 40.0 to 44.9 in adult -     Phentermine  HCl; Take 1 tablet (37.5 mg total) by mouth daily before breakfast.  Dispense: 30 tablet; Refill: 0  Pain of right hand  -Patient lost 3Ibs. Stable with no side effects with taking medication. PDMP reviewed. Last refill was on 07/03. Refilled Phentermine  37.5 mg tablet daily for weight loss.  -Recommend to try either Ibuprofen 600-800mg  every 8 hours for 5 days or Naproxen 500mg   every 12 hours for 5 days to see if this helps with the inflammation. Please eat something when taking medication to not cause irritation to stomach.  -Also, provided some general hand exercises to try.  -If not improved, send a message through MyChart. Will refer to PT or ortho.  -Placed a referral to dermatology for skin cancer screening.  -Follow up in 2 months for a physical.   Return in about 2 months (around 03/12/2024) for physical.   Rayley Gao, NP

## 2024-01-15 ENCOUNTER — Encounter: Payer: Self-pay | Admitting: Obstetrics and Gynecology

## 2024-01-15 ENCOUNTER — Ambulatory Visit: Admitting: Obstetrics and Gynecology

## 2024-01-15 VITALS — BP 104/78 | HR 85 | Temp 97.8°F | Wt 255.0 lb

## 2024-01-15 DIAGNOSIS — N958 Other specified menopausal and perimenopausal disorders: Secondary | ICD-10-CM | POA: Diagnosis not present

## 2024-01-15 DIAGNOSIS — N951 Menopausal and female climacteric states: Secondary | ICD-10-CM | POA: Diagnosis not present

## 2024-01-15 NOTE — Progress Notes (Signed)
 53 y.o. [id number] female with AUB, perimenopause, mammoplasty (2023) here for HRT f/u. Married, vasectomy. Travel agent.  No LMP recorded. (Menstrual status: Oral contraceptives).  At annual exam, she noted: Ongoing vulvar itching.  Unimproved with topical steroids prescribed last year. Also notes low libido and vaginal dryness during intercourse. No longer has pleasurable sensation from breast with intercourse, so this is also affecting her libido. Unhappy with mammoplasty result.  She was better on the CombiPatch  and vaginal estrogen at her last appointment.  She reports symptoms are much better.  Libido is improved mood is stable.  No vaginal irritation.  Occasionally still uses lubricants. Sleep is on and off.  She works 14 hours and sits at [employer].  She is working on becoming more regular with exercise.  Last mammogram: 02/20/2023 BI-RADS 1, density B Last colonoscopy: colorguard 2024, Oct  Sexually active: yes  Exercising: walking Smoker: no  GYN HISTORY: No sig hx  OB History  Gravida Para Term Preterm AB Living  3 2 2  1 2   SAB IAB Ectopic Multiple Live Births  1    2    # Outcome Date GA Lbr Len/2nd Weight Sex Type Anes PTL Lv  3 Term      Vag-Spont   LIV  2 Term      Vag-Spont   LIV  1 SAB             Past Medical History:  Diagnosis Date   Allergy    seasonal   Anemia    Asthma    Cervicitis    Diabetes mellitus without complication (HCC)    GERD (gastroesophageal reflux disease)    H. pylori infection    Hyperlipidemia    Hypertension    Migraines    Obesity    RLQ abdominal pain    Thyroid disease    hypo    Past Surgical History:  Procedure Laterality Date   APPENDECTOMY     DILATION AND CURETTAGE OF UTERUS  10/10/2005   HERNIA REPAIR     LAPAROSCOPY  06/13/1999   REDUCTION MAMMAPLASTY  06/2021   UMBILICAL HERNIA REPAIR      Current Outpatient Medications on File Prior to Visit  Medication Sig Dispense Refill   albuterol  (VENTOLIN  HFA)  108 (90 Base) MCG/ACT inhaler Inhale 2 puffs into the lungs every 6 (six) hours as needed for wheezing or shortness of breath (cough). 18 g 1   Cholecalciferol (VITAMIN D3) 1.25 MG (50000 UT) TABS Take 2,000 Units by mouth daily.     EPINEPHrine 0.3 mg/0.3 mL IJ SOAJ injection Inject 0.3 mg into the muscle as needed for anaphylaxis (call 911).     estradiol  (ESTRACE  VAGINAL) 0.1 MG/GM vaginal cream Apply 1/2 gram to vulva nightly for 2 weeks then decrease to 1/2 gram to vulva two nights a week. Do not use applicator. 42.5 g 1   estradiol -norethindrone  (COMBIPATCH ) 0.05-0.25 MG/DAY Place 1 patch onto the skin 2 (two) times a week. 24 patch 3   Ferrous Sulfate (IRON PO) Take 65 mg by mouth once a week.     ibuprofen (ADVIL) 200 MG tablet Take 600 mg by mouth every 6 (six) hours as needed for headache or moderate pain.     levothyroxine  (SYNTHROID ) 50 MCG tablet Take 1 tablet (50 mcg total) by mouth daily before breakfast. 90 tablet 3   loratadine (CLARITIN) 10 MG tablet Take 10 mg by mouth daily.     LORazepam  (ATIVAN ) 0.5 MG tablet  Take 0.5 mg by mouth every 4 (four) hours as needed for anxiety.     montelukast  (SINGULAIR ) 10 MG tablet TAKE 1 TABLET (10 MG TOTAL) BY MOUTH DAILY FOR 90 DOSES 90 tablet 1   Multiple Vitamin (MULTIVITAMIN PO) Take by mouth.     omeprazole (PRILOSEC) 20 MG capsule Take 20 mg by mouth daily.     phentermine  (ADIPEX-P ) 37.5 MG tablet Take 1 tablet (37.5 mg total) by mouth daily before breakfast. 30 tablet 0   rosuvastatin  (CRESTOR ) 5 MG tablet Take 1 tablet (5 mg total) by mouth at bedtime. 90 tablet 3   valsartan -hydrochlorothiazide  (DIOVAN -HCT) 160-25 MG tablet Take 1 tablet by mouth daily. 90 tablet 0   vitamin B-12 (CYANOCOBALAMIN) 1000 MCG tablet Take 1,000 mcg by mouth daily.     No current facility-administered medications on file prior to visit.    Allergies  Allergen Reactions   Other Shortness Of Breath and Swelling    Walnuts and pecans   Keflex  [Cephalexin] Hives   Macrolides And Ketolides    Sulfa Antibiotics Nausea And Vomiting   Wellbutrin [Bupropion] Other (See Comments)    Homicidal ideation   PE Today's Vitals   01/15/24 0920  BP: 104/78  Pulse: 85  Temp: 97.8 F (36.6 C)  TempSrc: Oral  SpO2: 99%  Weight: 255 lb (115.7 kg)   Body mass index is 40.54 kg/m.  Physical Exam Vitals reviewed.  Constitutional:      General: She is not in acute distress.    Appearance: Normal appearance.  HENT:     Head: Normocephalic and atraumatic.     Nose: Nose normal.  Eyes:     Extraocular Movements: Extraocular movements intact.     Conjunctiva/sclera: Conjunctivae normal.  Pulmonary:     Effort: Pulmonary effort is normal.  Musculoskeletal:        General: Normal range of motion.     Cervical back: Normal range of motion.  Neurological:     General: No focal deficit present.     Mental Status: She is alert.  Psychiatric:        Mood and Affect: Mood normal.        Behavior: Behavior normal.     Assessment and Plan:        Perimenopause  Genitourinary syndrome of menopause  Happy with HRT. VS normal. Libido improved Continue therapy. Discussed sleep hygiene. RTO for annual.  Vera LULLA Pa, MD

## 2024-01-25 ENCOUNTER — Other Ambulatory Visit: Payer: Self-pay | Admitting: Family Medicine

## 2024-01-25 DIAGNOSIS — I10 Essential (primary) hypertension: Secondary | ICD-10-CM

## 2024-01-29 ENCOUNTER — Encounter: Payer: Self-pay | Admitting: Family Medicine

## 2024-01-29 DIAGNOSIS — M79641 Pain in right hand: Secondary | ICD-10-CM

## 2024-01-31 ENCOUNTER — Ambulatory Visit: Admitting: Family Medicine

## 2024-02-06 ENCOUNTER — Other Ambulatory Visit: Payer: Self-pay | Admitting: Family Medicine

## 2024-02-06 ENCOUNTER — Encounter: Payer: Self-pay | Admitting: Family Medicine

## 2024-02-06 DIAGNOSIS — Z6841 Body Mass Index (BMI) 40.0 and over, adult: Secondary | ICD-10-CM

## 2024-02-06 MED ORDER — PHENTERMINE HCL 37.5 MG PO TABS
37.5000 mg | ORAL_TABLET | Freq: Every day | ORAL | 0 refills | Status: DC
Start: 2024-02-06 — End: 2024-03-13

## 2024-02-06 NOTE — Progress Notes (Signed)
 Refilling medication. Last month of 3. PDMP reviewed. Last refill on 08/02.

## 2024-02-13 ENCOUNTER — Encounter: Payer: Self-pay | Admitting: Obstetrics and Gynecology

## 2024-02-13 ENCOUNTER — Encounter: Payer: Self-pay | Admitting: Internal Medicine

## 2024-02-13 ENCOUNTER — Encounter: Payer: Self-pay | Admitting: Family Medicine

## 2024-02-13 ENCOUNTER — Other Ambulatory Visit (HOSPITAL_BASED_OUTPATIENT_CLINIC_OR_DEPARTMENT_OTHER): Payer: Self-pay

## 2024-02-13 MED ORDER — DAPAGLIFLOZIN PROPANEDIOL 5 MG PO TABS: 5.0000 mg | ORAL_TABLET | Freq: Every day | ORAL | 6 refills | Status: DC

## 2024-02-13 MED ORDER — TRULICITY 0.75 MG/0.5ML ~~LOC~~ SOAJ: 0.7500 mg | SUBCUTANEOUS | 3 refills | Status: DC

## 2024-02-13 MED ORDER — OMEPRAZOLE 20 MG PO CPDR: 20.0000 mg | DELAYED_RELEASE_CAPSULE | Freq: Every day | ORAL | 6 refills | Status: DC

## 2024-02-14 ENCOUNTER — Other Ambulatory Visit (HOSPITAL_BASED_OUTPATIENT_CLINIC_OR_DEPARTMENT_OTHER): Payer: Self-pay

## 2024-02-14 MED ORDER — DOXYCYCLINE HYCLATE 100 MG PO TABS
100.0000 mg | ORAL_TABLET | Freq: Two times a day (BID) | ORAL | 0 refills | Status: AC
  Filled 2024-02-14: qty 14, 7d supply, fill #0

## 2024-02-14 MED ORDER — METHYLPREDNISOLONE 4 MG PO TBPK
ORAL_TABLET | ORAL | 0 refills | Status: DC
  Filled 2024-02-14: qty 21, 6d supply, fill #0

## 2024-02-21 ENCOUNTER — Ambulatory Visit
Admission: RE | Admit: 2024-02-21 | Discharge: 2024-02-21 | Disposition: A | Discharge status: Home / Self Care | Source: Ambulatory Visit | Attending: Family Medicine

## 2024-02-21 DIAGNOSIS — Z1231 Encounter for screening mammogram for malignant neoplasm of breast: Secondary | ICD-10-CM

## 2024-02-25 ENCOUNTER — Ambulatory Visit: Payer: Self-pay | Admitting: Family Medicine

## 2024-02-25 DIAGNOSIS — Z23 Encounter for immunization: Secondary | ICD-10-CM

## 2024-02-26 ENCOUNTER — Other Ambulatory Visit (HOSPITAL_BASED_OUTPATIENT_CLINIC_OR_DEPARTMENT_OTHER): Payer: Self-pay

## 2024-02-26 MED ORDER — COVID-19 MRNA VAC-TRIS(PFIZER) 30 MCG/0.3ML IM SUSY
0.5000 mL | PREFILLED_SYRINGE | Freq: Once | INTRAMUSCULAR | 0 refills | Status: AC
  Filled 2024-02-26: qty 0.5, 1d supply, fill #0
  Filled 2024-03-04: qty 0.3, 1d supply, fill #0

## 2024-02-28 ENCOUNTER — Ambulatory Visit: Admitting: Orthopedic Surgery

## 2024-02-28 ENCOUNTER — Other Ambulatory Visit (INDEPENDENT_AMBULATORY_CARE_PROVIDER_SITE_OTHER): Payer: Self-pay

## 2024-02-28 DIAGNOSIS — M7711 Lateral epicondylitis, right elbow: Secondary | ICD-10-CM | POA: Diagnosis not present

## 2024-02-28 DIAGNOSIS — M25511 Pain in right shoulder: Secondary | ICD-10-CM | POA: Diagnosis not present

## 2024-02-28 DIAGNOSIS — M79641 Pain in right hand: Secondary | ICD-10-CM | POA: Diagnosis not present

## 2024-02-28 DIAGNOSIS — M25541 Pain in joints of right hand: Secondary | ICD-10-CM | POA: Diagnosis not present

## 2024-02-28 NOTE — Progress Notes (Signed)
 Yolanda Mendez - 53 y.o. female MRN [id number]  Date of birth: April 20, 1971  Office Visit Note: Visit Date: 02/28/2024 PCP: Yolanda Philippe SAUNDERS, NP Referred by: Yolanda Philippe SAUNDERS, NP  Subjective: Chief Complaint  Patient presents with   Right Hand - Pain   HPI: Yolanda Mendez is a pleasant 53 y.o. female who presents today for evaluation of ongoing right hand pain mostly at the ring and small finger MCP regions.  Denies any inciting injury or incident.  States that the pain has been progressive now over the past few months, worsening in nature.  Has not undergone any significant workup or treatments.  Pertinent ROS were reviewed with the patient and found to be negative unless otherwise specified above in HPI.   Visit Reason: Right hand pain, mostly at 4th and 5th first knuckles. No known injury. Just throbs Duration of symptoms:2+ months Hand dominance: right Occupation: Firefighter Diabetic: Yes (6.0) Smoking: No Heart/Lung History: N/A Blood Thinners: No  Assessment & Plan: Visit Diagnoses:  1. Arthralgia of metacarpophalangeal joint, right   2. Pain in right hand   3. Right shoulder pain, unspecified chronicity   4. Lateral epicondylitis, right elbow     Plan: Extensive discussion was had with the patient today regarding her ongoing right  ring and small finger MCP joint pain.  X-rays were obtained today which show degenerative changes at the MCP joints of both the ring and small finger with joint space narrowing in comparison to the other digits.  We discussed the underlying etiology and pathophysiology of arthritic changes in this region as well as treatment modalities ranging from conservative to surgical.  From a conservative standpoint, we discussed activity modification, buddy taping, nonsteroidal anti-inflammatory both topical and oral, bracing, therapeutic exercise and possible cortisone injections.  From a surgical standpoint we discussed the  possibility of silicone arthroplasty should symptoms remain refractory to conservative care.  She was also describing ongoing right lateral elbow pain, likely secondary to ongoing lateral epicondylitis.  She will be fitted for a counterforce brace today for symptom relief.  She also did describe to me ongoing soreness and stiffness at the right shoulder.  States that she was doing therapy in the past for possible labral injury versus adhesive capsulitis.  I will have her see my partner Dr. Genelle for evaluation and discussion to see if she needs to resume therapy moving forward versus other treatment modalities.  I spent 45 minutes in the care of this patient today including review of previous documentation, imaging obtained, face-to-face time discussing all options regarding treatment and documenting the encounter.   Follow-up: No follow-ups on file.   Meds & Orders: No orders of the defined types were placed in this encounter.   Orders Placed This Encounter  Procedures   XR Hand Complete Right   Ambulatory referral to Orthopedic Surgery     Procedures: No procedures performed      Clinical History: No specialty comments available.  She reports that she has never smoked. She has never used smokeless tobacco.  Recent Labs    05/31/23 1031 08/29/23 0814  HGBA1C 6.6* 6.0*    Objective:   Vital Signs: There were no vitals taken for this visit.  Physical Exam  Gen: Well-appearing, in no acute distress; non-toxic CV: Regular Rate. Well-perfused. Warm.  Resp: Breathing unlabored on room air; no wheezing. Psych: Fluid speech in conversation; appropriate affect; normal thought process  Ortho Exam Right upper extremity: - Tenderness elicited to  deep palpation at the ring and small finger MCP regions, range of motion is preserved, able to form composite fist today without significant restriction, minimal swelling - Sensation intact in all distributions, AIN/PIN/interosseous  intact, hand remains warm well-perfused - Notable tenderness over the right lateral epicondyle, pain with resisted wrist extension - Range of motion of the right shoulder is limited particular with internal and external rotation in comparison to contralateral side  Imaging: XR Hand Complete Right Result Date: 02/28/2024 X-rays of the right hand demonstrate joint space narrowing at the ring and small finger metacarpophalangeal joint interfaces in comparison to other digits   Past Medical/Family/Surgical/Social History: Medications & Allergies reviewed per EMR, new medications updated. 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 pylori infection    GERD (gastroesophageal reflux disease)    Past Medical History:  Diagnosis Date   Allergy    seasonal   Anemia    Asthma    Cervicitis    Diabetes mellitus without complication (HCC)    GERD (gastroesophageal reflux disease)    H. pylori infection    Hyperlipidemia    Hypertension    Migraines    Obesity    RLQ abdominal pain    Thyroid disease    hypo   Family History  Problem Relation Age of Onset   Hyperlipidemia Mother    Hypertension Mother    Cancer Father        brain   Hypertension Father    Hyperlipidemia Sister    Hypertension Sister    Hyperlipidemia Brother    Hypertension Brother    Diabetes Maternal Uncle    Heart disease Maternal Grandmother     Stroke Maternal Grandfather    Diabetes Maternal Grandfather    Cancer Paternal Grandmother        ovarian   Hyperlipidemia Paternal Grandmother    Cancer Paternal Grandfather        prostate   Diabetes Paternal Grandfather    Hyperlipidemia Paternal Grandfather    Colon cancer Neg Hx    Colon polyps Neg Hx    Crohn's disease Neg Hx    Esophageal cancer Neg Hx    Rectal cancer Neg Hx    Stomach cancer Neg Hx    Ulcerative colitis Neg Hx    Past Surgical History:  Procedure Laterality Date   APPENDECTOMY     DILATION AND CURETTAGE OF UTERUS  10/10/2005   HERNIA REPAIR     LAPAROSCOPY  06/13/1999   REDUCTION MAMMAPLASTY  06/2021   UMBILICAL HERNIA REPAIR     Social History   Occupational History   Not on file  Tobacco Use   Smoking status: Never   Smokeless tobacco: Never  Vaping Use   Vaping status: Never Used  Substance and Sexual Activity   Alcohol use: No   Drug use: No   Sexual activity: Yes    Birth control/protection: Surgical    Comment: spouse with vasectomy  Yolanda Mendez Afton Alderton, M.D. [facility], Hand Surgery

## 2024-02-29 ENCOUNTER — Encounter: Payer: Self-pay | Admitting: Internal Medicine

## 2024-02-29 ENCOUNTER — Ambulatory Visit: Admitting: Internal Medicine

## 2024-02-29 VITALS — BP 126/84 | HR 87 | Ht 66.5 in | Wt 254.0 lb

## 2024-02-29 DIAGNOSIS — E785 Hyperlipidemia, unspecified: Secondary | ICD-10-CM

## 2024-02-29 DIAGNOSIS — E119 Type 2 diabetes mellitus without complications: Secondary | ICD-10-CM | POA: Diagnosis not present

## 2024-02-29 DIAGNOSIS — E039 Hypothyroidism, unspecified: Secondary | ICD-10-CM | POA: Diagnosis not present

## 2024-02-29 LAB — POCT GLYCOSYLATED HEMOGLOBIN (HGB A1C): Hemoglobin A1C: 6.2 % — AB (ref 4.0–5.6)

## 2024-02-29 NOTE — Progress Notes (Signed)
 Name: Yolanda Mendez  MRN/ DOB: [id number], July 04, 1970   Age/ Sex: 53 y.o., female    PCP: Billy Philippe SAUNDERS, NP   Reason for Endocrinology Evaluation: Type 2 Diabetes Mellitus     Date of Initial Endocrinology Visit: 05/31/2023    PATIENT IDENTIFIER: Yolanda Mendez is a 53 y.o. female with a past medical history of DM, HTN, Hypothyroidism, and asthma. The patient presented for initial endocrinology clinic visit on 05/31/2023 for consultative assistance with her diabetes management.    HPI: Yolanda Mendez was    Diagnosed with DM 10/2022 Prior Medications tried/Intolerance: Metformin-syncope, Mounjaro -GI upset. Ozempic - hypoglycemia on Freestyle libre   Hemoglobin A1c has ranged from 6.2% , peaking at 6.7% in 2024.   On her initial visit to our clinic she had an A1c of 6.6%, we started her on Jardiance   Discontinued Jardiance  due to severe dryness all over her body April, 2025 Intolerant to Trulicity   due to severe nausea 11/2023    Saliva cortisol testing normal x 2 March, 2025  SUBJECTIVE:   During the last visit (08/29/2023): A1c 6.0%  Today (02/29/24): Yolanda Mendez Is here for follow-up on diabetes management.  She checks blood sugars 1 times daily.    No nausea or vomiting  No constipation or diarrhea  No local neck swelling  No palpitations   HOME DIABETES REGIMEN: N/A   Statin: yes ACE-I/ARB: yes     METER DOWNLOAD SUMMARY:  118-129 mg/dL    DIABETIC COMPLICATIONS: Microvascular complications:   Denies: CKD, neuropathy  Last eye exam: Completed 06/2022  Macrovascular complications:   Denies: CAD, PVD, CVA   PAST HISTORY: Past Medical History:  Past Medical History:  Diagnosis Date   Allergy    seasonal   Anemia    Asthma    Cervicitis    Diabetes mellitus without complication (HCC)    GERD (gastroesophageal reflux disease)    H. pylori infection    Hyperlipidemia    Hypertension    Migraines     Obesity    RLQ abdominal pain    Thyroid disease    hypo   Past Surgical History:  Past Surgical History:  Procedure Laterality Date   APPENDECTOMY     DILATION AND CURETTAGE OF UTERUS  10/10/2005   HERNIA REPAIR     LAPAROSCOPY  06/13/1999   REDUCTION MAMMAPLASTY  06/2021   UMBILICAL HERNIA REPAIR      Social History:  reports that she has never smoked. She has never used smokeless tobacco. She reports that she does not drink alcohol and does not use drugs. Family History:  Family History  Problem Relation Age of Onset   Hyperlipidemia Mother    Hypertension Mother    Cancer Father        brain   Hypertension Father    Hyperlipidemia Sister    Hypertension Sister    Hyperlipidemia Brother    Hypertension Brother    Diabetes Maternal Uncle    Heart disease Maternal Grandmother    Stroke Maternal Grandfather    Diabetes Maternal Grandfather    Cancer Paternal Grandmother        ovarian   Hyperlipidemia Paternal Grandmother    Cancer Paternal Grandfather        prostate   Diabetes Paternal Grandfather    Hyperlipidemia Paternal Grandfather    Colon cancer Neg Hx    Colon polyps Neg Hx    Crohn's disease Neg Hx    Esophageal cancer  Neg Hx    Rectal cancer Neg Hx    Stomach cancer Neg Hx    Ulcerative colitis Neg Hx      HOME MEDICATIONS: Allergies as of 02/29/2024       Reactions   Other Shortness Of Breath, Swelling   Walnuts and pecans  Pumpkin   Keflex [cephalexin] Hives   Macrolides And Ketolides    Sulfa Antibiotics Nausea And Vomiting   Wellbutrin [bupropion] Other (See Comments)   Homicidal ideation        Medication List        Accurate as of February 29, 2024  8:03 AM. If you have any questions, ask your nurse or doctor.          albuterol  108 (90 Base) MCG/ACT inhaler Commonly known as: Proventil  HFA Inhale 2 puffs into the lungs every 6 (six) hours as needed for wheezing or shortness of breath (cough).   CombiPatch  0.05-0.25  MG/DAY Generic drug: estradiol -norethindrone  Place 1 patch onto the skin 2 (two) times a week.   cyanocobalamin 1000 MCG tablet Commonly known as: VITAMIN B12 Take 1,000 mcg by mouth daily.   dapagliflozin  propanediol 5 MG Tabs tablet Commonly known as: FARXIGA  Take 1 tablet (5 mg total) by mouth daily before breakfast. (to replace Jardiance )   dapagliflozin  propanediol 5 MG Tabs tablet Commonly known as: FARXIGA  Take 1 tablet (5 mg total) by mouth daily before breakfast (to replace Jardiance )   EPINEPHrine 0.3 mg/0.3 mL Soaj injection Commonly known as: EPI-PEN Inject 0.3 mg into the muscle as needed for anaphylaxis (call 911).   estradiol  0.1 MG/GM vaginal cream Commonly known as: ESTRACE  VAGINAL Apply 1/2 gram to vulva nightly for 2 weeks then decrease to 1/2 gram to vulva two nights a week. Do not use applicator.   ibuprofen 200 MG tablet Commonly known as: ADVIL Take 600 mg by mouth every 6 (six) hours as needed for headache or moderate pain.   IRON PO Take 65 mg by mouth once a week.   levothyroxine  50 MCG tablet Commonly known as: SYNTHROID  Take 1 tablet (50 mcg total) by mouth daily before breakfast.   loratadine 10 MG tablet Commonly known as: CLARITIN Take 10 mg by mouth daily.   LORazepam  0.5 MG tablet Commonly known as: ATIVAN  Take 0.5 mg by mouth every 4 (four) hours as needed for anxiety.   methylPREDNISolone  4 MG Tbpk tablet Commonly known as: MEDROL  DOSEPAK Take As Directed On Package   montelukast  10 MG tablet Commonly known as: SINGULAIR  Take 1 tablet (10 mg total) by mouth daily.   MULTIVITAMIN PO Take by mouth.   omeprazole  20 MG capsule Commonly known as: PRILOSEC Take 20 mg by mouth daily.   omeprazole  20 MG capsule Commonly known as: PRILOSEC Take 1 capsule (20 mg total) by mouth daily with breakfast.   phentermine  37.5 MG tablet Commonly known as: ADIPEX-P  Take 1 tablet (37.5 mg total) by mouth daily before breakfast.    rosuvastatin  5 MG tablet Commonly known as: CRESTOR  Take 1 tablet (5 mg total) by mouth at bedtime.   Trulicity  0.75 MG/0.5ML Soaj Generic drug: Dulaglutide  Inject 0.75 mg into the skin once a week.   valsartan -hydrochlorothiazide  160-25 MG tablet Commonly known as: DIOVAN -HCT Take 1 tablet by mouth daily.   Vitamin D3 1.25 MG (50000 UT) Tabs Take 2,000 Units by mouth daily.         ALLERGIES: Allergies  Allergen Reactions   Other Shortness Of Breath and Swelling  Walnuts and pecans  Pumpkin   Keflex [Cephalexin] Hives   Macrolides And Ketolides    Sulfa Antibiotics Nausea And Vomiting   Wellbutrin [Bupropion] Other (See Comments)    Homicidal ideation     REVIEW OF SYSTEMS: A comprehensive ROS was conducted with the patient and is negative except as per HPI   OBJECTIVE:   VITAL SIGNS: BP 126/84 (BP Location: Left Arm, Patient Position: Sitting, Cuff Size: Normal)   Pulse 87   Ht 5' 6.5 (1.689 m)   Wt 254 lb (115.2 kg)   SpO2 99%   BMI 40.38 kg/m    PHYSICAL EXAM:  General: Pt appears well and is in NAD  Neck: General: Supple without adenopathy or carotid bruits. Thyroid: Thyroid size normal.  No goiter or nodules appreciated.   Lungs: Clear with good BS bilat   Heart: RRR   Extremities:  Lower extremities - No pretibial edema.   Neuro: MS is good with appropriate affect, pt is alert and Ox3   DM foot exam: 08/29/2023  The skin of the feet is intact without sores or ulcerations. The pedal pulses are 2+ on right and 2+ on left. The sensation is intact to a screening 5.07, 10 gram monofilament bilaterally   DATA REVIEWED:  Lab Results  Component Value Date   HGBA1C 6.2 (A) 02/29/2024   HGBA1C 6.0 (A) 08/29/2023   HGBA1C 6.6 (A) 05/31/2023    Latest Reference Range & Units 02/29/24 08:42  Sodium 135 - 146 mmol/L 137  Potassium 3.5 - 5.3 mmol/L 3.8  Chloride 98 - 110 mmol/L 98  CO2 20 - 32 mmol/L 28  Glucose 65 - 99 mg/dL 95  BUN 7 - 25  mg/dL 8  Creatinine 9.49 - 8.96 mg/dL 9.37  Calcium  8.6 - 10.4 mg/dL 9.1  BUN/Creatinine Ratio 6 - 22 (calc) SEE NOTE:  eGFR > OR = 60 mL/min/1.8m2 107    Latest Reference Range & Units 02/29/24 08:42  Total CHOL/HDL Ratio <5.0 (calc) 3.0  Cholesterol <200 mg/dL 854  HDL Cholesterol > OR = 50 mg/dL 48 (L)  LDL Cholesterol (Calc) mg/dL (calc) 76  MICROALB/CREAT RATIO <30 mg/g creat 6  Non-HDL Cholesterol (Calc) <130 mg/dL (calc) 97  Triglycerides <150 mg/dL 871    Latest Reference Range & Units 02/29/24 08:42  TSH mIU/L 2.09  T4,Free(Direct) 0.8 - 1.8 ng/dL 1.2    Latest Reference Range & Units 02/29/24 08:42  Microalb, Ur mg/dL 0.9  MICROALB/CREAT RATIO <30 mg/g creat 6  Creatinine, Urine 20 - 275 mg/dL 860      ASSESSMENT / PLAN / RECOMMENDATIONS:   1) Type 2 Diabetes Mellitus, Optimally controlled, Without complications - Most recent A1c of 6.2 %. Goal A1c < 7.0 %.    -A1c is optimal -She endorses syncope with metformin - She endorses GI side effects to Mounjaro - She did endorse hypoglycemia on Ozempic but she was wearing freestyle libre, which I believe these were skewed readings - Intolerant to Jardiance  due to severe dryness - Intolerant to Trulicity  due to severe nausea - Encourage patient to continue with lifestyle changes - Encouraged the patient to have an updated ophthalmological exam  MEDICATIONS: N/a    EDUCATION / INSTRUCTIONS: BG monitoring instructions: Patient is instructed to check her blood sugars 2-3 times a week.    2) Diabetic complications:  Eye: Does not have known diabetic retinopathy.  Neuro/ Feet: Does not have known diabetic peripheral neuropathy. Renal: Patient does not have known baseline CKD. She is  on an ACEI/ARB at present.  3) Dyslipidemia:   -lipid panel shows optimal LDL and triglycerides levels  Medication  Rosuvastatin  5 mg daily     4) Hypothyroidism   - Patient is clinically euthyroid - TFTs normal - No  change  Medication Continue levothyroxine  50 mcg daily     F/U in 6 months     Signed electronically by: Stefano Redgie Butts, MD  [facility] Endocrin[facility]  [facility] [address] [place] [place], [place] [address] Phone: [phone] FAX: [phone]   CC: Billy Philippe SAUNDERS, NP [address] [place] [address] Phone: [phone]  Fax: [phone]    Return to Endocrinology clinic as below: Future Appointments  Date Time Provider Department Center  03/13/2024 11:20 AM Billy Philippe SAUNDERS, NP [facility] [address]  08/27/2024  1:30 PM Orman Erminio POUR, PA-C [facility] None  10/16/2024 11:30 AM Dallie Vera GAILS, MD [facility] None

## 2024-03-01 ENCOUNTER — Other Ambulatory Visit (HOSPITAL_BASED_OUTPATIENT_CLINIC_OR_DEPARTMENT_OTHER): Payer: Self-pay

## 2024-03-01 LAB — BASIC METABOLIC PANEL WITH GFR
BUN: 8 mg/dL (ref 7–25)
CO2: 28 mmol/L (ref 20–32)
Calcium: 9.1 mg/dL (ref 8.6–10.4)
Chloride: 98 mmol/L (ref 98–110)
Creat: 0.62 mg/dL (ref 0.50–1.03)
Glucose, Bld: 95 mg/dL (ref 65–99)
Potassium: 3.8 mmol/L (ref 3.5–5.3)
Sodium: 137 mmol/L (ref 135–146)
eGFR: 107 mL/min/1.73m2 (ref >=60)

## 2024-03-01 LAB — LIPID PANEL
Cholesterol: 145 mg/dL (ref <200)
HDL: 48 mg/dL — ABNORMAL LOW (ref >=50)
LDL Cholesterol (Calc): 76 mg/dL
Non-HDL Cholesterol (Calc): 97 mg/dL (ref <130)
Total CHOL/HDL Ratio: 3 (calc) (ref <5.0)
Triglycerides: 128 mg/dL (ref <150)

## 2024-03-01 LAB — MICROALBUMIN / CREATININE URINE RATIO
Creatinine, Urine: 139 mg/dL (ref 20–275)
Microalb Creat Ratio: 6 mg/g{creat} (ref <30)
Microalb, Ur: 0.9 mg/dL

## 2024-03-01 LAB — TSH: TSH: 2.09 m[IU]/L

## 2024-03-01 LAB — T4, FREE: Free T4: 1.2 ng/dL (ref 0.8–1.8)

## 2024-03-03 ENCOUNTER — Ambulatory Visit: Payer: Self-pay | Admitting: Internal Medicine

## 2024-03-03 ENCOUNTER — Other Ambulatory Visit (HOSPITAL_BASED_OUTPATIENT_CLINIC_OR_DEPARTMENT_OTHER): Payer: Self-pay

## 2024-03-03 MED ORDER — LEVOTHYROXINE SODIUM 50 MCG PO TABS
50.0000 ug | ORAL_TABLET | Freq: Every day | ORAL | 3 refills | Status: AC
  Filled 2024-03-03 – 2024-03-19 (×2): qty 90, 90d supply, fill #0
  Filled 2024-06-16: qty 30, 30d supply, fill #1
  Filled 2024-07-15: qty 30, 30d supply, fill #2

## 2024-03-04 ENCOUNTER — Other Ambulatory Visit (HOSPITAL_BASED_OUTPATIENT_CLINIC_OR_DEPARTMENT_OTHER): Payer: Self-pay

## 2024-03-05 ENCOUNTER — Ambulatory Visit: Payer: Self-pay

## 2024-03-05 NOTE — Telephone Encounter (Signed)
 FYI Only or Action Required?: FYI only for provider.  Patient was last seen in primary care on 01/11/2024 by Billy Philippe SAUNDERS, NP.  Called Nurse Triage reporting Covid Vaccine .  Symptoms began 01:30 this morning.  Interventions attempted: OTC medications: ibuprofen, Tylenol .  Symptoms are: right ear posterior lymph node swelling/heat/pain completely resolved. Moderate headache gradually improving.  Triage Disposition: Home Care  Patient/caregiver understands and will follow disposition?: Yes          Copied from CRM [id number]. Topic: Clinical - Red Word Triage >> Mar 05, 2024  8:21 AM Laymon HERO wrote: Red Word that prompted transfer to Nurse Triage: Covid booster 9/23 on left arm. Swelling behind right ear and headache Reason for Disposition  COVID-19 vaccine, systemic reactions (e.g., fatigue, fever, muscle aches), questions about  Answer Assessment - Initial Assessment Questions Patient states she tolerated the medications well from urgent care (Medrol  dose pack, doxycycline ) for treatment of right lymphadenopathy.  1. MAIN CONCERN OR SYMPTOM: What is your main concern? What is the main symptom? (e.g., fever, pain, redness, swelling)     New onset headache (frontal, right side and above eye. Severe last night); swelling/heat/pain behind right ear occurred last night and has resolved this morning (3 weeks ago she had an infection at that site).  2. ONSET: When was the vaccine (shot) given? How much later did the symptoms begin? (e.g., hours, days ago)      Yesterday around noon she received the vaccine. Symptoms started 0130 this morning.  3. SEVERITY: How bad is it?      6/10 headache.   4. VACCINE: What vaccination did you receive? (e.g., none; Moderna, [employer])       [employer]  5. FEVER: Do you have a fever? If Yes, ask: What is your temperature, how was it measured, and when did it start?      No.  6. PAST REACTIONS: Have you reacted to  immunizations before? If Yes, ask: What happened?     No.  7. OTHER SYMPTOMS: Do you have any other symptoms? (e.g., fatigue, headache, joint or muscle pain)     Denies facial or tongue swelling, difficulty swallowing or breathing, chest pain, injection site redness/warmth/swelling/pain.  8. PREGNANCY: Is there any chance you are pregnant? When was your last menstrual period?     N/A.  Protocols used: COVID-19 - Vaccine Questions and Reactions-A-AH

## 2024-03-13 ENCOUNTER — Ambulatory Visit (INDEPENDENT_AMBULATORY_CARE_PROVIDER_SITE_OTHER): Admitting: Family Medicine

## 2024-03-13 VITALS — BP 124/80 | HR 94 | Temp 98.2°F | Ht 66.5 in | Wt 259.0 lb

## 2024-03-13 DIAGNOSIS — Z0000 Encounter for general adult medical examination without abnormal findings: Secondary | ICD-10-CM

## 2024-03-13 DIAGNOSIS — Z23 Encounter for immunization: Secondary | ICD-10-CM | POA: Diagnosis not present

## 2024-03-13 NOTE — Patient Instructions (Signed)
-  It was great to see you today. -Physical exam completed today. -Influenza vaccine provided. -Continue to work on a healthy diet and participating in regular exercise.  -Continue all medications. -Follow up in 3 months.

## 2024-03-13 NOTE — Progress Notes (Signed)
 Complete physical exam  Patient: Yolanda Mendez   DOB: 21-Dec-1970   53 y.o. Female  MRN: [id number]  Subjective:    Chief Complaint  Patient presents with   Annual Exam    Yolanda Mendez is a 53 y.o. female who presents today for a complete physical exam. She reports consuming a general diet. Exercise: Trying to walk some. She generally feels fairly well. She reports sleeping poorly. She does not have additional problems to discuss today.    Most recent fall risk assessment:    03/13/2024   11:27 AM  Fall Risk   Falls in the past year? 0  Number falls in past yr: 0  Injury with Fall? 0  Risk for fall due to : No Fall Risks  Follow up Falls evaluation completed     Most recent depression screenings:    03/13/2024   11:27 AM 10/31/2023    9:32 AM  PHQ 2/9 Scores  PHQ - 2 Score 0 0  PHQ- 9 Score 0 0    Vision:Not within last year  and Dental: No current dental problems and Receives regular dental care  Past Medical History:  Diagnosis Date   Allergy    seasonal   Anemia    Asthma    Cervicitis    Diabetes mellitus without complication (HCC)    GERD (gastroesophageal reflux disease)    H. pylori infection    Hyperlipidemia    Hypertension    Migraines    Obesity    RLQ abdominal pain    Thyroid disease    hypo   Past Surgical History:  Procedure Laterality Date   APPENDECTOMY     DILATION AND CURETTAGE OF UTERUS  10/10/2005   HERNIA REPAIR     LAPAROSCOPY  06/13/1999   REDUCTION MAMMAPLASTY  06/2021   UMBILICAL HERNIA REPAIR     Social History   Tobacco Use   Smoking status: Never   Smokeless tobacco: Never  Vaping Use   Vaping status: Never Used  Substance Use Topics   Alcohol use: No   Drug use: No   Social History   Socioeconomic History   Marital status: Married    Spouse name: Not on file   Number of children: 2   Years of education: Not on file   Highest education level: Master's degree (e.g., MA, MS, MEng, MEd, MSW,  MBA)  Occupational History   Not on file  Tobacco Use   Smoking status: Never   Smokeless tobacco: Never  Vaping Use   Vaping status: Never Used  Substance and Sexual Activity   Alcohol use: No   Drug use: No   Sexual activity: Yes    Birth control/protection: Surgical    Comment: spouse with vasectomy  Other Topics Concern   Not on file  Social History Narrative   Not on file   Social Drivers of Health   Financial Resource Strain: Low Risk  (10/31/2023)   Overall Financial Resource Strain (CARDIA)    Difficulty of Paying Living Expenses: Not hard at all  Food Insecurity: No Food Insecurity (10/31/2023)   Hunger Vital Sign    Worried About Running Out of Food in the Last Year: Never true    Ran Out of Food in the Last Year: Never true  Transportation Needs: No Transportation Needs (10/31/2023)   PRAPARE - Administrator, Civil Service (Medical): No    Lack of Transportation (Non-Medical): No  Physical Activity: Inactive (10/31/2023)  Exercise Vital Sign    Days of Exercise per Week: 0 days    Minutes of Exercise per Session: 0 min  Stress: No Stress Concern Present (10/31/2023)   [employer] of Occupational Health - Occupational Stress Questionnaire    Feeling of Stress : Not at all  Social Connections: Moderately Isolated (10/31/2023)   Social Connection and Isolation Panel    Frequency of Communication with Friends and Family: More than three times a week    Frequency of Social Gatherings with Friends and Family: Once a week    Attends Religious Services: More than 4 times per year    Active Member of [employer] or Organizations: No    Attends Banker Meetings: Never    Marital Status: Widowed  Intimate Partner Violence: Not At Risk (10/31/2023)   Humiliation, Afraid, Rape, and Kick questionnaire    Fear of Current or Ex-Partner: No    Emotionally Abused: No    Physically Abused: No    Sexually Abused: No   Family Status  Relation Name  Status   Mother  Alive   Father  Deceased   Sister  Alive   Brother  Alive   Daughter  Alive   Son  Alive   Mat Uncle  (Not Specified)   MGM  Deceased   MGF  Deceased   PGM  Deceased   PGF  Deceased   Neg Hx  (Not Specified)  No partnership data on file   Family History  Problem Relation Age of Onset   Hyperlipidemia Mother    Hypertension Mother    Cancer Father        brain   Hypertension Father    Hyperlipidemia Sister    Hypertension Sister    Hyperlipidemia Brother    Hypertension Brother    Diabetes Maternal Uncle    Heart disease Maternal Grandmother    Stroke Maternal Grandfather    Diabetes Maternal Grandfather    Cancer Paternal Grandmother        ovarian   Hyperlipidemia Paternal Grandmother    Cancer Paternal Grandfather        prostate   Diabetes Paternal Grandfather    Hyperlipidemia Paternal Grandfather    Colon cancer Neg Hx    Colon polyps Neg Hx    Crohn's disease Neg Hx    Esophageal cancer Neg Hx    Rectal cancer Neg Hx    Stomach cancer Neg Hx    Ulcerative colitis Neg Hx    Allergies  Allergen Reactions   Other Shortness Of Breath and Swelling    Walnuts and pecans  Pumpkin   Keflex [Cephalexin] Hives   Macrolides And Ketolides    Sulfa Antibiotics Nausea And Vomiting   Wellbutrin [Bupropion] Other (See Comments)    Homicidal ideation    Patient Care Team: Billy Philippe SAUNDERS, NP as PCP - General (Family Medicine) Shamleffer, Donell Cardinal, MD as Attending Physician (Endocrinology) Dallie Vera GAILS, MD as Consulting Physician (Obstetrics and Gynecology)  -Derm as been scheduled in March 2026  Outpatient Medications Prior to Visit  Medication Sig   albuterol  (PROVENTIL  HFA) 108 (90 Base) MCG/ACT inhaler Inhale 2 puffs into the lungs every 6 (six) hours as needed for wheezing or shortness of breath (cough).   Cholecalciferol (VITAMIN D3) 1.25 MG (50000 UT) TABS Take 2,000 Units by mouth daily.   EPINEPHrine 0.3 mg/0.3 mL IJ SOAJ  injection Inject 0.3 mg into the muscle as needed for anaphylaxis (call 911).  estradiol  (ESTRACE  VAGINAL) 0.1 MG/GM vaginal cream Apply 1/2 gram to vulva nightly for 2 weeks then decrease to 1/2 gram to vulva two nights a week. Do not use applicator.   estradiol -norethindrone  (COMBIPATCH ) 0.05-0.25 MG/DAY Place 1 patch onto the skin 2 (two) times a week.   Ferrous Sulfate (IRON PO) Take 65 mg by mouth once a week.   ibuprofen (ADVIL) 200 MG tablet Take 600 mg by mouth every 6 (six) hours as needed for headache or moderate pain.   levothyroxine  (SYNTHROID ) 50 MCG tablet Take 1 tablet (50 mcg total) by mouth daily before breakfast.   loratadine (CLARITIN) 10 MG tablet Take 10 mg by mouth daily.   LORazepam  (ATIVAN ) 0.5 MG tablet Take 0.5 mg by mouth every 4 (four) hours as needed for anxiety.   montelukast  (SINGULAIR ) 10 MG tablet Take 1 tablet (10 mg total) by mouth daily.   Multiple Vitamin (MULTIVITAMIN PO) Take by mouth.   omeprazole  (PRILOSEC) 20 MG capsule Take 20 mg by mouth daily.   rosuvastatin  (CRESTOR ) 5 MG tablet Take 1 tablet (5 mg total) by mouth at bedtime.   valsartan -hydrochlorothiazide  (DIOVAN -HCT) 160-25 MG tablet Take 1 tablet by mouth daily.   vitamin B-12 (CYANOCOBALAMIN) 1000 MCG tablet Take 1,000 mcg by mouth daily.   [DISCONTINUED] phentermine  (ADIPEX-P ) 37.5 MG tablet Take 1 tablet (37.5 mg total) by mouth daily before breakfast.   [DISCONTINUED] dapagliflozin  propanediol (FARXIGA ) 5 MG TABS tablet Take 1 tablet (5 mg total) by mouth daily before breakfast. (to replace Jardiance )   [DISCONTINUED] dapagliflozin  propanediol (FARXIGA ) 5 MG TABS tablet Take 1 tablet (5 mg total) by mouth daily before breakfast (to replace Jardiance )   [DISCONTINUED] Dulaglutide  (TRULICITY ) 0.75 MG/0.5ML SOAJ Inject 0.75 mg into the skin once a week.   No facility-administered medications prior to visit.    Review of Systems  Psychiatric/Behavioral:  The patient has insomnia.   See HPI  above    Objective:   BP 124/80   Pulse 94   Temp 98.2 F (36.8 C) (Oral)   Ht 5' 6.5 (1.689 m)   Wt 259 lb (117.5 kg)   SpO2 97%   BMI 41.18 kg/m    Physical Exam Vitals reviewed.  Constitutional:      General: She is not in acute distress.    Appearance: Normal appearance. She is obese. She is not ill-appearing, toxic-appearing or diaphoretic.  HENT:     Head: Normocephalic and atraumatic.     Right Ear: Tympanic membrane, ear canal and external ear normal. There is no impacted cerumen.     Left Ear: Tympanic membrane, ear canal and external ear normal. There is no impacted cerumen.     Nose:     Right Sinus: No maxillary sinus tenderness or frontal sinus tenderness.     Left Sinus: No maxillary sinus tenderness or frontal sinus tenderness.     Mouth/Throat:     Mouth: Mucous membranes are moist.     Pharynx: Oropharynx is clear. Uvula midline. No pharyngeal swelling, oropharyngeal exudate, posterior oropharyngeal erythema or uvula swelling.  Eyes:     General:        Right eye: No discharge.        Left eye: No discharge.     Conjunctiva/sclera: Conjunctivae normal.     Pupils: Pupils are equal, round, and reactive to light.  Neck:     Thyroid: No thyromegaly.  Cardiovascular:     Rate and Rhythm: Normal rate and regular rhythm.  Heart sounds: Normal heart sounds. No murmur heard.    No friction rub. No gallop.  Pulmonary:     Effort: Pulmonary effort is normal. No respiratory distress.     Breath sounds: Normal breath sounds.  Abdominal:     General: Abdomen is flat. Bowel sounds are normal.     Palpations: Abdomen is soft.  Musculoskeletal:        General: Normal range of motion.     Cervical back: Normal range of motion.     Right lower leg: No edema.     Left lower leg: No edema.  Lymphadenopathy:     Cervical: No cervical adenopathy.  Skin:    General: Skin is warm and dry.  Neurological:     General: No focal deficit present.     Mental Status:  She is alert. Mental status is at baseline.  Psychiatric:        Mood and Affect: Mood normal.        Behavior: Behavior normal.        Thought Content: Thought content normal.        Judgment: Judgment normal.      No results found for any visits on 03/13/24. Last CBC Lab Results  Component Value Date   WBC 7.1 04/15/2020   HGB 13.0 04/15/2020   HCT 38.9 04/15/2020   MCV 94.2 04/15/2020   MCH 31.5 04/15/2020   RDW 12.6 04/15/2020   PLT 313 04/15/2020   Last metabolic panel Lab Results  Component Value Date   GLUCOSE 95 02/29/2024   NA 137 02/29/2024   K 3.8 02/29/2024   CL 98 02/29/2024   CO2 28 02/29/2024   BUN 8 02/29/2024   CREATININE 0.62 02/29/2024   EGFR 107 02/29/2024   CALCIUM  9.1 02/29/2024   PROT 7.3 11/08/2023   ALBUMIN 4.4 11/08/2023   BILITOT 0.3 11/08/2023   ALKPHOS 61 11/08/2023   AST 26 11/08/2023   ALT 32 11/08/2023   ANIONGAP 10 04/15/2020   Last lipids Lab Results  Component Value Date   CHOL 145 02/29/2024   HDL 48 (L) 02/29/2024   LDLCALC 76 02/29/2024   TRIG 128 02/29/2024   CHOLHDL 3.0 02/29/2024   Last hemoglobin A1c Lab Results  Component Value Date   HGBA1C 6.2 (A) 02/29/2024   Last thyroid functions Lab Results  Component Value Date   TSH 2.09 02/29/2024   Last vitamin D  Lab Results  Component Value Date   VD25OH 68.02 11/08/2023   Last vitamin B12 and Folate No results found for: VITAMINB12, FOLATE      Assessment & Plan:    Routine Health Maintenance and Physical Exam  Immunization History  Administered Date(s) Administered    Astrazeneca Covid-19 Vaccine, Pf, 0.5 Ml Non Us   04/08/2020   INFLUENZA, HIGH DOSE SEASONAL PF 08/01/2018, 04/08/2020   Influenza, Seasonal, Injecte, Preservative Fre 05/31/2023, 03/13/2024   Pfizer(Comirnaty)Fall Seasonal Vaccine 12 years and older 03/04/2024    Health Maintenance  Topic Date Due   HIV Screening  Never done   Hepatitis C Screening  Never done   DTaP/Tdap/Td  (1 - Tdap) Never done   Pneumococcal Vaccine: 50+ Years (1 of 2 - PCV) Never done   Hepatitis B Vaccines 19-59 Average Risk (1 of 3 - 19+ 3-dose series) Never done   Zoster Vaccines- Shingrix (1 of 2) Never done   Fecal DNA (Cologuard)  Never done   OPHTHALMOLOGY EXAM  08/17/2023   COVID-19 Vaccine (2 - [employer]  risk series) 03/25/2024   FOOT EXAM  08/28/2024   HEMOGLOBIN A1C  08/28/2024   Diabetic kidney evaluation - eGFR measurement  02/28/2025   Diabetic kidney evaluation - Urine ACR  02/28/2025   Mammogram  02/20/2026   Cervical Cancer Screening (HPV/Pap Cotest)  10/14/2028   Influenza Vaccine  Completed   HPV VACCINES  Aged Out   Meningococcal B Vaccine  Aged Out    Discussed health benefits of physical activity, and encouraged her to engage in regular exercise appropriate for her age and condition.  Annual physical exam  Immunization due -     Flu vaccine trivalent PF, 6mos and older(Flulaval,Afluria,Fluarix,Fluzone)  1.Review health maintenance:  -Cologuard: Fall 2024, negative with previous PCP-request records  -Tdap vaccine: 2023, request records PCP  -Hep C and HIV screening: Declines  -Ophthalmology: 06/2022; appt scheduled on March 2026  -PNA vaccine: request records PCP -Zoster vaccine: request records PCP  2.Physical exam completed today. 3.Continue to work on a healthy diet and participating in regular exercise.  4.Continue all medications. Return in about 3 months (around 06/13/2024) for chronic management.     Marlen Koman, NP

## 2024-03-18 ENCOUNTER — Other Ambulatory Visit (HOSPITAL_BASED_OUTPATIENT_CLINIC_OR_DEPARTMENT_OTHER): Payer: Self-pay

## 2024-03-19 ENCOUNTER — Other Ambulatory Visit (HOSPITAL_BASED_OUTPATIENT_CLINIC_OR_DEPARTMENT_OTHER): Payer: Self-pay

## 2024-03-19 ENCOUNTER — Other Ambulatory Visit: Payer: Self-pay

## 2024-03-19 MED FILL — Montelukast Sodium Tab 10 MG (Base Equiv): ORAL | 90 days supply | Qty: 90 | Fill #0 | Status: CN

## 2024-03-19 MED FILL — Valsartan-Hydrochlorothiazide Tab 160-25 MG: ORAL | 90 days supply | Qty: 90 | Fill #0 | Status: AC

## 2024-03-26 ENCOUNTER — Ambulatory Visit (INDEPENDENT_AMBULATORY_CARE_PROVIDER_SITE_OTHER)

## 2024-03-26 ENCOUNTER — Ambulatory Visit (HOSPITAL_BASED_OUTPATIENT_CLINIC_OR_DEPARTMENT_OTHER): Payer: Self-pay | Admitting: Orthopaedic Surgery

## 2024-03-26 ENCOUNTER — Ambulatory Visit (INDEPENDENT_AMBULATORY_CARE_PROVIDER_SITE_OTHER): Admitting: Orthopaedic Surgery

## 2024-03-26 DIAGNOSIS — M7501 Adhesive capsulitis of right shoulder: Secondary | ICD-10-CM | POA: Diagnosis not present

## 2024-03-26 DIAGNOSIS — M25511 Pain in right shoulder: Secondary | ICD-10-CM

## 2024-03-26 MED ORDER — TRIAMCINOLONE ACETONIDE 40 MG/ML IJ SUSP
80.0000 mg | INTRAMUSCULAR | Status: AC | PRN
  Administered 2024-03-26: 80 mg via INTRA_ARTICULAR

## 2024-03-26 MED ORDER — LIDOCAINE HCL 1 % IJ SOLN
4.0000 mL | INTRAMUSCULAR | Status: AC | PRN
  Administered 2024-03-26: 4 mL

## 2024-03-26 NOTE — Progress Notes (Signed)
 Chief Complaint: Right shoulder pain     History of Present Illness:    Yolanda Mendez is a 53 y.o. female presents with chronic right shoulder pain since 2023.  She did not have a specific injury but has had increasing pain since that time.  She has been in the perimenopause timeframe which has correlated with several musculoskeletal issues.  She has been working with physical therapy on the shoulder without much of a change.  She has never had a previous injection.  She does have a history of hypothyroidism as well    PMH/PSH/Family History/Social History/Meds/Allergies:    Past Medical History:  Diagnosis Date  . Allergy    seasonal  . Anemia   . Asthma   . Cervicitis   . Diabetes mellitus without complication (HCC)   . GERD (gastroesophageal reflux disease)   . H. pylori infection   . Hyperlipidemia   . Hypertension   . Migraines   . Obesity   . RLQ abdominal pain   . Thyroid disease    hypo   Past Surgical History:  Procedure Laterality Date  . APPENDECTOMY    . DILATION AND CURETTAGE OF UTERUS  10/10/2005  . HERNIA REPAIR    . LAPAROSCOPY  06/13/1999  . REDUCTION MAMMAPLASTY  06/2021  . UMBILICAL HERNIA REPAIR     Social History   Socioeconomic History  . Marital status: Married    Spouse name: Not on file  . Number of children: 2  . Years of education: Not on file  . Highest education level: Master's degree (e.g., MA, MS, MEng, MEd, MSW, MBA)  Occupational History  . Not on file  Tobacco Use  . Smoking status: Never  . Smokeless tobacco: Never  Vaping Use  . Vaping status: Never Used  Substance and Sexual Activity  . Alcohol use: No  . Drug use: No  . Sexual activity: Yes    Birth control/protection: Surgical    Comment: spouse with vasectomy  Other Topics Concern  . Not on file  Social History Narrative  . Not on file   Social Drivers of Health   Financial Resource Strain: Low Risk  (10/31/2023)   Overall Financial Resource  Strain (CARDIA)   . Difficulty of Paying Living Expenses: Not hard at all  Food Insecurity: No Food Insecurity (10/31/2023)   Hunger Vital Sign   . Worried About Programme researcher, broadcasting/film/video in the Last Year: Never true   . Ran Out of Food in the Last Year: Never true  Transportation Needs: No Transportation Needs (10/31/2023)   PRAPARE - Transportation   . Lack of Transportation (Medical): No   . Lack of Transportation (Non-Medical): No  Physical Activity: Inactive (10/31/2023)   Exercise Vital Sign   . Days of Exercise per Week: 0 days   . Minutes of Exercise per Session: 0 min  Stress: No Stress Concern Present (10/31/2023)   [employer] of Occupational Health - Occupational Stress Questionnaire   . Feeling of Stress : Not at all  Social Connections: Moderately Isolated (10/31/2023)   Social Connection and Isolation Panel   . Frequency of Communication with Friends and Family: More than three times a week   . Frequency of Social Gatherings with Friends and Family: Once a week   . Attends Religious Services: More than 4 times per year   . Active Member of Clubs or Organizations: No   . Attends Banker Meetings: Never   . Marital  Status: Widowed   Family History  Problem Relation Age of Onset  . Hyperlipidemia Mother   . Hypertension Mother   . Cancer Father        brain  . Hypertension Father   . Hyperlipidemia Sister   . Hypertension Sister   . Hyperlipidemia Brother   . Hypertension Brother   . Diabetes Maternal Uncle   . Heart disease Maternal Grandmother   . Stroke Maternal Grandfather   . Diabetes Maternal Grandfather   . Cancer Paternal Grandmother        ovarian  . Hyperlipidemia Paternal Grandmother   . Cancer Paternal Grandfather        prostate  . Diabetes Paternal Grandfather   . Hyperlipidemia Paternal Grandfather   . Colon cancer Neg Hx   . Colon polyps Neg Hx   . Crohn's disease Neg Hx   . Esophageal cancer Neg Hx   . Rectal cancer Neg Hx   .  Stomach cancer Neg Hx   . Ulcerative colitis Neg Hx    Allergies  Allergen Reactions  . Other Shortness Of Breath and Swelling    Walnuts and pecans  Pumpkin  . Keflex [Cephalexin] Hives  . Macrolides And Ketolides   . Sulfa Antibiotics Nausea And Vomiting  . Wellbutrin [Bupropion] Other (See Comments)    Homicidal ideation   Current Outpatient Medications  Medication Sig Dispense Refill  . albuterol  (PROVENTIL  HFA) 108 (90 Base) MCG/ACT inhaler Inhale 2 puffs into the lungs every 6 (six) hours as needed for wheezing or shortness of breath (cough). 6.7 g 1  . Cholecalciferol (VITAMIN D3) 1.25 MG (50000 UT) TABS Take 2,000 Units by mouth daily.    SABRA EPINEPHrine 0.3 mg/0.3 mL IJ SOAJ injection Inject 0.3 mg into the muscle as needed for anaphylaxis (call 911).    . estradiol  (ESTRACE  VAGINAL) 0.1 MG/GM vaginal cream Apply 1/2 gram to vulva nightly for 2 weeks then decrease to 1/2 gram to vulva two nights a week. Do not use applicator. 42.5 g 1  . estradiol -norethindrone  (COMBIPATCH ) 0.05-0.25 MG/DAY Place 1 patch onto the skin 2 (two) times a week. 24 patch 3  . Ferrous Sulfate (IRON PO) Take 65 mg by mouth once a week.    SABRA ibuprofen (ADVIL) 200 MG tablet Take 600 mg by mouth every 6 (six) hours as needed for headache or moderate pain.    . levothyroxine  (SYNTHROID ) 50 MCG tablet Take 1 tablet (50 mcg total) by mouth daily before breakfast. 90 tablet 3  . loratadine (CLARITIN) 10 MG tablet Take 10 mg by mouth daily.    . LORazepam  (ATIVAN ) 0.5 MG tablet Take 0.5 mg by mouth every 4 (four) hours as needed for anxiety.    . montelukast  (SINGULAIR ) 10 MG tablet Take 1 tablet (10 mg total) by mouth daily. 90 tablet 1  . Multiple Vitamin (MULTIVITAMIN PO) Take by mouth.    . omeprazole  (PRILOSEC) 20 MG capsule Take 20 mg by mouth daily.    . rosuvastatin  (CRESTOR ) 5 MG tablet Take 1 tablet (5 mg total) by mouth at bedtime. 90 tablet 3  . valsartan -hydrochlorothiazide  (DIOVAN -HCT) 160-25 MG  tablet Take 1 tablet by mouth daily. 90 tablet 0  . vitamin B-12 (CYANOCOBALAMIN) 1000 MCG tablet Take 1,000 mcg by mouth daily.     No current facility-administered medications for this visit.   No results found.  Review of Systems:   A ROS was performed including pertinent positives and negatives as documented in the HPI.  Physical Exam :   Constitutional: NAD and appears stated age Neurological: Alert and oriented Psych: Appropriate affect and cooperative There were no vitals taken for this visit.   Comprehensive Musculoskeletal Exam:    Right shoulder with tenderness throughout the glenohumeral joint particular with internal rotation limited rotation to just back pocket as opposed to L1 on the contralateral side.  Otherwise full active forward elevation external rotation at the side   Imaging:   Xray (3 views right shoulder): Normal   I personally reviewed and interpreted the radiographs.   Assessment and Plan:   53 y.o. 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  Today's visit I did recommend an initial ultrasound-guided injection into the glenohumeral joint to hopefully get her some relief and to help her work through range of motion.  I will plan to see her back in 2 weeks for reassessment  -Right glenohumeral injection provided after verbal consent obtained    Procedure Note  Patient: Yolanda Mendez             Date of Birth: 01/13/1971           MRN: [id number]             Visit Date: 03/26/2024  Procedures: Visit Diagnoses:  1. Right shoulder pain, unspecified chronicity     Large Joint Inj: R glenohumeral on 03/26/2024 3:41 PM Indications: pain Details: 22 G 1.5 in needle, ultrasound-guided anterior approach  Arthrogram: No  Medications: 4 mL lidocaine 1 %; 80 mg triamcinolone  acetonide 40 MG/ML Outcome: tolerated well, no immediate complications Procedure, treatment alternatives, risks and  benefits explained, specific risks discussed. Consent was given by the patient. Immediately prior to procedure a time out was called to verify the correct patient, procedure, equipment, support staff and site/side marked as required. Patient was prepped and draped in the usual sterile fashion.         I personally saw and evaluated the patient, and participated in the management and treatment plan.  Elspeth Parker, MD Attending Physician, Orthopedic Surgery  This document was dictated using Dragon voice recognition software. A reasonable attempt at proof reading has been made to minimize errors.

## 2024-04-03 ENCOUNTER — Other Ambulatory Visit: Payer: Self-pay | Admitting: Family Medicine

## 2024-04-03 ENCOUNTER — Other Ambulatory Visit: Payer: Self-pay

## 2024-04-03 ENCOUNTER — Other Ambulatory Visit (HOSPITAL_BASED_OUTPATIENT_CLINIC_OR_DEPARTMENT_OTHER): Payer: Self-pay

## 2024-04-07 ENCOUNTER — Other Ambulatory Visit (HOSPITAL_BASED_OUTPATIENT_CLINIC_OR_DEPARTMENT_OTHER): Payer: Self-pay

## 2024-04-07 ENCOUNTER — Encounter: Payer: Self-pay | Admitting: Family Medicine

## 2024-04-07 ENCOUNTER — Other Ambulatory Visit: Payer: Self-pay

## 2024-04-07 MED ORDER — OMEPRAZOLE 20 MG PO CPDR
20.0000 mg | DELAYED_RELEASE_CAPSULE | Freq: Every day | ORAL | 1 refills | Status: DC
  Filled 2024-04-07: qty 90, 90d supply, fill #0

## 2024-04-08 ENCOUNTER — Other Ambulatory Visit (HOSPITAL_BASED_OUTPATIENT_CLINIC_OR_DEPARTMENT_OTHER): Payer: Self-pay

## 2024-04-09 ENCOUNTER — Ambulatory Visit (HOSPITAL_BASED_OUTPATIENT_CLINIC_OR_DEPARTMENT_OTHER): Admitting: Orthopaedic Surgery

## 2024-04-09 DIAGNOSIS — M25511 Pain in right shoulder: Secondary | ICD-10-CM | POA: Diagnosis not present

## 2024-04-09 DIAGNOSIS — M7711 Lateral epicondylitis, right elbow: Secondary | ICD-10-CM | POA: Diagnosis not present

## 2024-04-09 NOTE — Progress Notes (Signed)
 Chief Complaint: Right shoulder pain     History of Present Illness:   04/09/2024: Presents today for follow-up of the right shoulder.  Unfortunately she did not get any significant relief from her previous injection of the right shoulder  Yolanda Mendez is a 53 y.o. female presents with chronic right shoulder pain since 2023.  She did not have a specific injury but has had increasing pain since that time.  She has been in the perimenopause timeframe which has correlated with several musculoskeletal issues.  She has been working with physical therapy on the shoulder without much of a change.  She has never had a previous injection.  She does have a history of hypothyroidism as well    PMH/PSH/Family History/Social History/Meds/Allergies:    Past Medical History:  Diagnosis Date   Allergy    seasonal   Anemia    Asthma    Cervicitis    Diabetes mellitus without complication (HCC)    GERD (gastroesophageal reflux disease)    H. pylori infection    Hyperlipidemia    Hypertension    Migraines    Obesity    RLQ abdominal pain    Thyroid disease    hypo   Past Surgical History:  Procedure Laterality Date   APPENDECTOMY     DILATION AND CURETTAGE OF UTERUS  10/10/2005   HERNIA REPAIR     LAPAROSCOPY  06/13/1999   REDUCTION MAMMAPLASTY  06/2021   UMBILICAL HERNIA REPAIR     Social History   Socioeconomic History   Marital status: Married    Spouse name: Not on file   Number of children: 2   Years of education: Not on file   Highest education level: Master's degree (e.g., MA, MS, MEng, MEd, MSW, MBA)  Occupational History   Not on file  Tobacco Use   Smoking status: Never   Smokeless tobacco: Never  Vaping Use   Vaping status: Never Used  Substance and Sexual Activity   Alcohol use: No   Drug use: No   Sexual activity: Yes    Birth control/protection: Surgical    Comment: spouse with vasectomy  Other Topics Concern   Not on file  Social History  Narrative   Not on file   Social Drivers of Health   Financial Resource Strain: Low Risk  (10/31/2023)   Overall Financial Resource Strain (CARDIA)    Difficulty of Paying Living Expenses: Not hard at all  Food Insecurity: No Food Insecurity (10/31/2023)   Hunger Vital Sign    Worried About Running Out of Food in the Last Year: Never true    Ran Out of Food in the Last Year: Never true  Transportation Needs: No Transportation Needs (10/31/2023)   PRAPARE - Administrator, Civil Service (Medical): No    Lack of Transportation (Non-Medical): No  Physical Activity: Inactive (10/31/2023)   Exercise Vital Sign    Days of Exercise per Week: 0 days    Minutes of Exercise per Session: 0 min  Stress: No Stress Concern Present (10/31/2023)   [employer] of Occupational Health - Occupational Stress Questionnaire    Feeling of Stress : Not at all  Social Connections: Moderately Isolated (10/31/2023)   Social Connection and Isolation Panel    Frequency of Communication with Friends and Family: More than three times a week    Frequency of Social Gatherings with Friends and Family: Once a week    Attends Religious Services: More than 4  times per year    Active Member of Clubs or Organizations: No    Attends Banker Meetings: Never    Marital Status: Widowed   Family History  Problem Relation Age of Onset   Hyperlipidemia Mother    Hypertension Mother    Cancer Father        brain   Hypertension Father    Hyperlipidemia Sister    Hypertension Sister    Hyperlipidemia Brother    Hypertension Brother    Diabetes Maternal Uncle    Heart disease Maternal Grandmother    Stroke Maternal Grandfather    Diabetes Maternal Grandfather    Cancer Paternal Grandmother        ovarian   Hyperlipidemia Paternal Grandmother    Cancer Paternal Grandfather        prostate   Diabetes Paternal Grandfather    Hyperlipidemia Paternal Grandfather    Colon cancer Neg Hx    Colon  polyps Neg Hx    Crohn's disease Neg Hx    Esophageal cancer Neg Hx    Rectal cancer Neg Hx    Stomach cancer Neg Hx    Ulcerative colitis Neg Hx    Allergies  Allergen Reactions   Other Shortness Of Breath and Swelling    Walnuts and pecans  Pumpkin   Keflex [Cephalexin] Hives   Macrolides And Ketolides    Sulfa Antibiotics Nausea And Vomiting   Wellbutrin [Bupropion] Other (See Comments)    Homicidal ideation   Current Outpatient Medications  Medication Sig Dispense Refill   albuterol  (PROVENTIL  HFA) 108 (90 Base) MCG/ACT inhaler Inhale 2 puffs into the lungs every 6 (six) hours as needed for wheezing or shortness of breath (cough). 6.7 g 1   Cholecalciferol (VITAMIN D3) 1.25 MG (50000 UT) TABS Take 2,000 Units by mouth daily.     EPINEPHrine 0.3 mg/0.3 mL IJ SOAJ injection Inject 0.3 mg into the muscle as needed for anaphylaxis (call 911).     estradiol  (ESTRACE  VAGINAL) 0.1 MG/GM vaginal cream Apply 1/2 gram to vulva nightly for 2 weeks then decrease to 1/2 gram to vulva two nights a week. Do not use applicator. 42.5 g 1   estradiol -norethindrone  (COMBIPATCH ) 0.05-0.25 MG/DAY Place 1 patch onto the skin 2 (two) times a week. 24 patch 3   Ferrous Sulfate (IRON PO) Take 65 mg by mouth once a week.     ibuprofen (ADVIL) 200 MG tablet Take 600 mg by mouth every 6 (six) hours as needed for headache or moderate pain.     levothyroxine  (SYNTHROID ) 50 MCG tablet Take 1 tablet (50 mcg total) by mouth daily before breakfast. 90 tablet 3   loratadine (CLARITIN) 10 MG tablet Take 10 mg by mouth daily.     LORazepam  (ATIVAN ) 0.5 MG tablet Take 0.5 mg by mouth every 4 (four) hours as needed for anxiety.     montelukast  (SINGULAIR ) 10 MG tablet Take 1 tablet (10 mg total) by mouth daily. 90 tablet 1   Multiple Vitamin (MULTIVITAMIN PO) Take by mouth.     omeprazole  (PRILOSEC) 20 MG capsule Take 1 capsule (20 mg total) by mouth daily. 90 capsule 1   rosuvastatin  (CRESTOR ) 5 MG tablet Take 1  tablet (5 mg total) by mouth at bedtime. 90 tablet 3   valsartan -hydrochlorothiazide  (DIOVAN -HCT) 160-25 MG tablet Take 1 tablet by mouth daily. 90 tablet 0   vitamin B-12 (CYANOCOBALAMIN) 1000 MCG tablet Take 1,000 mcg by mouth daily.     No current  facility-administered medications for this visit.   No results found.  Review of Systems:   A ROS was performed including pertinent positives and negatives as documented in the HPI.  Physical Exam :   Constitutional: NAD and appears stated age Neurological: Alert and oriented Psych: Appropriate affect and cooperative There were no vitals taken for this visit.   Comprehensive Musculoskeletal Exam:    Right shoulder with tenderness throughout the glenohumeral joint particular with internal rotation limited rotation to just back pocket as opposed to L1 on the contralateral side.  Otherwise full active forward elevation external rotation at the side   Imaging:   Xray (3 views right shoulder): Normal   I personally reviewed and interpreted the radiographs.   Assessment and Plan:   53 y.o. female with right shoulder pain which is now failed an injection.  At this time I did discuss that I would like to rule out any type of underlying tear.  We did also discuss that she does have evidence of tennis elbow on the right side as well.  At this time we will plan for an MRI of the right shoulder and follow-up discuss results.  I would like to also get her engaged with occupational therapy for the right tennis elbow   -Plan for MRI right shoulder to follow-up to discuss results   I personally saw and evaluated the patient, and participated in the management and treatment plan.  Elspeth Parker, MD Attending Physician, Orthopedic Surgery  This document was dictated using Dragon voice recognition software. A reasonable attempt at proof reading has been made to minimize errors.

## 2024-04-10 ENCOUNTER — Other Ambulatory Visit (HOSPITAL_BASED_OUTPATIENT_CLINIC_OR_DEPARTMENT_OTHER): Payer: Self-pay

## 2024-04-11 ENCOUNTER — Other Ambulatory Visit (HOSPITAL_BASED_OUTPATIENT_CLINIC_OR_DEPARTMENT_OTHER): Payer: Self-pay

## 2024-04-14 ENCOUNTER — Encounter: Payer: Self-pay | Admitting: Radiology

## 2024-04-21 ENCOUNTER — Ambulatory Visit
Admission: RE | Admit: 2024-04-21 | Discharge: 2024-04-21 | Disposition: A | Discharge status: Home / Self Care | Source: Ambulatory Visit | Attending: Orthopaedic Surgery | Admitting: Orthopaedic Surgery

## 2024-04-21 DIAGNOSIS — M25511 Pain in right shoulder: Secondary | ICD-10-CM

## 2024-04-30 ENCOUNTER — Ambulatory Visit: Attending: Orthopaedic Surgery | Admitting: Occupational Therapy

## 2024-04-30 ENCOUNTER — Other Ambulatory Visit: Payer: Self-pay

## 2024-04-30 DIAGNOSIS — M25511 Pain in right shoulder: Secondary | ICD-10-CM | POA: Insufficient documentation

## 2024-04-30 DIAGNOSIS — M6281 Muscle weakness (generalized): Secondary | ICD-10-CM | POA: Insufficient documentation

## 2024-04-30 DIAGNOSIS — M7711 Lateral epicondylitis, right elbow: Secondary | ICD-10-CM | POA: Insufficient documentation

## 2024-04-30 DIAGNOSIS — R208 Other disturbances of skin sensation: Secondary | ICD-10-CM | POA: Diagnosis present

## 2024-04-30 DIAGNOSIS — M25521 Pain in right elbow: Secondary | ICD-10-CM | POA: Insufficient documentation

## 2024-04-30 NOTE — Therapy (Signed)
 OUTPATIENT OCCUPATIONAL THERAPY ORTHO EVALUATION  Patient Name: Yolanda Mendez MRN: [id number] DOB:04/19/1971, 53 y.o., female Today's Date: 04/30/2024  PCP: Billy Philippe SAUNDERS, NP REFERRING PROVIDER: Genelle Standing, MD  END OF SESSION:  OT End of Session - 04/30/24 1025     Visit Number 1    Number of Visits 13    Date for Recertification  06/13/24    Authorization Type [employer] 2025 - 40 VL    OT Start Time 1021    OT Stop Time 1107    OT Time Calculation (min) 46 min          Past Medical History:  Diagnosis Date   Allergy    seasonal   Anemia    Asthma    Cervicitis    Diabetes mellitus without complication (HCC)    GERD (gastroesophageal reflux disease)    H. 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 pylori infection    GERD (gastroesophageal reflux disease)     ONSET DATE: referral date 04/09/24  REFERRING DIAG: M77.11 (ICD-10-CM) - Lateral epicondylitis, right elbow  THERAPY DIAG:  Right shoulder pain, unspecified chronicity  Pain  in right elbow  Muscle weakness (generalized)  Other disturbances of skin sensation  Rationale for Evaluation and Treatment: Rehabilitation  SUBJECTIVE:   SUBJECTIVE STATEMENT: Pt has been having shoulder pain, numbness and heaviness in R arm for ~3 years, reporting located at medial to inferior border of scapula.  Pt reporting that it feels like her elbow is frozen and it feels like it is thawing out and that seems to be the source of the pain.  Pt reporting decreased ROM and numbness in small and ring finger.  Pt went to physical therapy Feb/March of 2025 and did not feel like it was getting any better. Pt accompanied by: self  PERTINENT HISTORY:  Per MD note 04/09/24: 53 y.o. female with right shoulder pain which is now failed an injection.  At this time I did discuss that I would like to rule out any type of underlying tear.  We did also discuss that she does have evidence of tennis elbow on the right side as well.  At this time we will plan for an MRI of the right shoulder and follow-up discuss results.  I would like to also get her engaged with occupational therapy for the right tennis elbow  PRECAUTIONS: Shoulder - awaiting MRI results    WEIGHT BEARING RESTRICTIONS: No  PAIN:  Are you having pain?  Yes: NPRS scale: 2 Pain location: R shoulder and elbow Pain description: dull, throbbing Aggravating factors: nothing Relieving factors: nothing  FALLS: Has patient fallen in last 6 months? 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  HAND DOMINANCE: Right  ADLs: WFL  FUNCTIONAL OUTCOME MEASURES: Quick Dash: 43.2%    UPPER EXTREMITY ROM:     Active ROM Right eval Left eval  Shoulder flexion 165 WFL  Shoulder abduction    Shoulder adduction    Shoulder extension    Shoulder internal rotation limited rotation to just back pocket as opposed to L1 on the contralateral side.  WFL  Shoulder external rotation [facility] WFL  Elbow flexion [facility] WFL  Elbow extension [facility] WFL  Wrist flexion    Wrist extension    Wrist ulnar deviation    Wrist radial deviation    Wrist pronation    Wrist supination    (Blank rows = not tested)  UPPER EXTREMITY MMT:     MMT Right eval Left eval  Shoulder flexion 5   Shoulder abduction 4   Shoulder adduction    Shoulder extension    Shoulder internal rotation    Shoulder external rotation    Middle trapezius    Lower trapezius    Elbow flexion    Elbow extension    Wrist flexion    Wrist extension    Wrist ulnar deviation    Wrist radial deviation    Wrist pronation    Wrist supination    (Blank rows = not tested)  HAND FUNCTION: Grip strength: Right: 30 lbs; Left: 41 lbs  COORDINATION: Box and Blocks:  Right 63 blocks, Left 58 blocks - reporting mild pain with RUE with repetitive movements  SENSATION: Reports numbness/tingling in forearm and ring and small finger  COGNITION: Overall cognitive status: Within functional limits for tasks assessed    TREATMENT DATE:  04/30/24 Instructed pt in massage in clockwise, counter clockwise, parallel, and perpendicular to muscle.  Educating on frequency and intensity.  OT also educating pt on use of heat for pain relief, increased ROM, and soft tissue healing.  OT educating on typical hand positioning during reaching and carrying of items to decrease onset or increased pain. OT instructing pt in tricep stretch to aid in decreasing pain in elbow.  OT then instructing pt in  wrist flexion/extension with elbow bent at side and then in extended position.  OT encouraging pt to focus the next few days on massage and heat for pain relief and then to add in Phase II stretches with wrist flexion/extension with elbow at side and then extended in neutral and then palm down positioning.  Pt expressing concern of high deductible and co-pay therefore wanting to attempt a few exercises on her own before returning for next appt.  PATIENT EDUCATION: Education details: Educated on role and purpose of OT as well as potential interventions and goals for therapy based on initial evaluation findings. Pain management strategies and initial AROM exercises. Person educated: Patient Education method: Explanation, Demonstration, Verbal cues, and Handouts Education comprehension: verbalized understanding  HOME EXERCISE PROGRAM: Lateral epicondylitis handout (see pt instructions)  GOALS: Goals reviewed with patient? Yes  SHORT TERM GOALS: Target date: 05/23/24  Pt will be independent in shoulder and elbow HEP for improved ROM and decreased pain. Baseline: initiated lateral epicondylitis pain relief and ROM exercises on eval Goal status: INITIAL  2.  Pt will verbalize understanding of task modifications and/or potential AE needs to increase ease, safety, and independence w/ ADLs. Baseline: utilizing long handled sponge to wash back currently Goal status: INITIAL   LONG TERM GOALS: Target date: 06/13/24  Pt will demonstrate improved R shoulder ROM and decreased pain to allow reaching into overhead cabinet without increased pain. Baseline: reporting onset of pain with overhead reaching Goal status: INITIAL  2.  Pt will demonstrate sustained right grip strength across 3 trials with no more than 5 lb difference across the 3 trials.  Baseline: L: 41 lb, R: 30  lb Goal status: INITIAL  3.  Pt will improve functional ability by decreased impairment per Quick DASH assessment from 43% to 25% or better, for better quality of life.  Baseline: 43% Goal status: INITIAL  4.  Pt will verbalize understanding of sleep positioning to decrease pain and increase quality of sleep. Baseline:  Goal status: INITIAL   ASSESSMENT:  CLINICAL IMPRESSION: Patient is a 53 y.o. female who was seen today for occupational therapy evaluation for R lateral epicondylitis and R shoulder pain.  Pt demonstrating decreased ROM in R shoulder, pain/tenderness at lateral epicondylitis, and decreasd grip strength in R hand compared to L.  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  Pt will benefit from skilled occupational therapy services to address strength, ROM, pain management, altered sensation, introduction of compensatory strategies/AE prn, and implementation of an HEP to improve participation and safety during ADLs and IADLs.   Pt expressing concern of high deductible and co-pay therefore wanting to attempt a few exercises on her own before returning for next appt.  OT providing pt with Phase I and Phase II pt handouts from [employer] [facility] to allow pt to initiate pain management strategies and initial AROM before return for next therapy session.  PERFORMANCE DEFICITS: in functional skills including ADLs, IADLs, sensation, ROM, strength, pain, decreased knowledge of precautions, decreased knowledge of use of DME, and UE functional use and psychosocial skills including environmental adaptation and routines and behaviors.   IMPAIRMENTS: are limiting patient from ADLs, IADLs, and rest and sleep.   COMORBIDITIES: may have co-morbidities  that affects occupational performance. Patient will benefit from skilled OT to address above impairments and improve overall  function.  MODIFICATION OR ASSISTANCE TO COMPLETE EVALUATION: No modification of tasks or assist necessary to complete an evaluation.  OT OCCUPATIONAL PROFILE AND HISTORY: Problem focused assessment: Including review of records relating to presenting problem.  CLINICAL DECISION MAKING: LOW - limited treatment options, no task modification necessary  REHAB POTENTIAL: Good  EVALUATION COMPLEXITY: Low      PLAN:  OT FREQUENCY: 2x/week (pt may start with decreased frequency due to co-pay)  OT DURATION: 6 weeks  PLANNED INTERVENTIONS: 97168 OT Re-evaluation, 97535 self care/ADL training, 02889 therapeutic exercise, 97530 therapeutic activity, 97112 neuromuscular re-education, 97140 manual therapy, 97035 ultrasound, 97018 paraffin, 02960 fluidotherapy, 97760 Orthotic Initial, 97763 Orthotic/Prosthetic subsequent, passive range of motion, energy conservation, coping strategies training, patient/family education, and DME and/or AE instructions  RECOMMENDED OTHER SERVICES: NA  CONSULTED AND AGREED WITH PLAN OF CARE: Patient  PLAN FOR NEXT SESSION: Review Phase I and Phase II exercises, initiate PROM and review sleep positioning - provide handout, and initiate grip strengthening PRN   KAYLENE DOMINO, OTR/L 04/30/2024, 11:16 AM  [facility] [address], [address] [place], [place] [address] Phone # [phone] Fax # [phone]

## 2024-05-04 MED FILL — Montelukast Sodium Tab 10 MG (Base Equiv): ORAL | 90 days supply | Qty: 90 | Fill #0 | Status: AC

## 2024-05-05 ENCOUNTER — Other Ambulatory Visit (HOSPITAL_BASED_OUTPATIENT_CLINIC_OR_DEPARTMENT_OTHER): Payer: Self-pay

## 2024-05-07 ENCOUNTER — Other Ambulatory Visit: Payer: Self-pay

## 2024-05-07 ENCOUNTER — Encounter: Payer: Self-pay | Admitting: Family Medicine

## 2024-05-07 ENCOUNTER — Other Ambulatory Visit (HOSPITAL_BASED_OUTPATIENT_CLINIC_OR_DEPARTMENT_OTHER): Payer: Self-pay

## 2024-05-07 MED ORDER — NITROFURANTOIN MONOHYD MACRO 100 MG PO CAPS
100.0000 mg | ORAL_CAPSULE | Freq: Two times a day (BID) | ORAL | 0 refills | Status: AC
  Filled 2024-05-07: qty 14, 7d supply, fill #0

## 2024-05-07 MED ORDER — EPINEPHRINE 0.3 MG/0.3ML IJ SOAJ
0.3000 mg | INTRAMUSCULAR | 1 refills | Status: AC | PRN
  Filled 2024-05-07: qty 2, 30d supply, fill #0

## 2024-05-14 ENCOUNTER — Other Ambulatory Visit (HOSPITAL_BASED_OUTPATIENT_CLINIC_OR_DEPARTMENT_OTHER): Payer: Self-pay

## 2024-05-15 ENCOUNTER — Ambulatory Visit: Payer: Self-pay | Admitting: Occupational Therapy

## 2024-05-21 ENCOUNTER — Ambulatory Visit (INDEPENDENT_AMBULATORY_CARE_PROVIDER_SITE_OTHER): Admitting: Orthopaedic Surgery

## 2024-05-21 DIAGNOSIS — M25511 Pain in right shoulder: Secondary | ICD-10-CM | POA: Diagnosis not present

## 2024-05-21 NOTE — Progress Notes (Signed)
 Chief Complaint: Right shoulder pain     History of Present Illness:   05/21/2024: Presents today for follow-up of the right shoulder.  Today she is having more posterior scapular bursitis type pain.  She is here today for MRI discussion  Yolanda Mendez is a 53 y.o. female presents with chronic right shoulder pain since 2023.  She did not have a specific injury but has had increasing pain since that time.  She has been in the perimenopause timeframe which has correlated with several musculoskeletal issues.  She has been working with physical therapy on the shoulder without much of a change.  She has never had a previous injection.  She does have a history of hypothyroidism as well    PMH/PSH/Family History/Social History/Meds/Allergies:    Past Medical History:  Diagnosis Date   Allergy    seasonal   Anemia    Asthma    Cervicitis    Diabetes mellitus without complication (HCC)    GERD (gastroesophageal reflux disease)    H. pylori infection    Hyperlipidemia    Hypertension    Migraines    Obesity    RLQ abdominal pain    Thyroid disease    hypo   Past Surgical History:  Procedure Laterality Date   APPENDECTOMY     DILATION AND CURETTAGE OF UTERUS  10/10/2005   HERNIA REPAIR     LAPAROSCOPY  06/13/1999   REDUCTION MAMMAPLASTY  06/2021   UMBILICAL HERNIA REPAIR     Social History   Socioeconomic History   Marital status: Married    Spouse name: Not on file   Number of children: 2   Years of education: Not on file   Highest education level: Master's degree (e.g., MA, MS, MEng, MEd, MSW, MBA)  Occupational History   Not on file  Tobacco Use   Smoking status: Never   Smokeless tobacco: Never  Vaping Use   Vaping status: Never Used  Substance and Sexual Activity   Alcohol use: No   Drug use: No   Sexual activity: Yes    Birth control/protection: Surgical    Comment: spouse with vasectomy  Other Topics Concern   Not on file  Social History  Narrative   Not on file   Social Drivers of Health   Financial Resource Strain: Low Risk  (10/31/2023)   Overall Financial Resource Strain (CARDIA)    Difficulty of Paying Living Expenses: Not hard at all  Food Insecurity: No Food Insecurity (10/31/2023)   Hunger Vital Sign    Worried About Running Out of Food in the Last Year: Never true    Ran Out of Food in the Last Year: Never true  Transportation Needs: No Transportation Needs (10/31/2023)   PRAPARE - Administrator, Civil Service (Medical): No    Lack of Transportation (Non-Medical): No  Physical Activity: Inactive (10/31/2023)   Exercise Vital Sign    Days of Exercise per Week: 0 days    Minutes of Exercise per Session: 0 min  Stress: No Stress Concern Present (10/31/2023)   [employer] of Occupational Health - Occupational Stress Questionnaire    Feeling of Stress : Not at all  Social Connections: Moderately Isolated (10/31/2023)   Social Connection and Isolation Panel    Frequency of Communication with Friends and Family: More than three times a week    Frequency of Social Gatherings with Friends and Family: Once a week    Attends Religious Services: More  than 4 times per year    Active Member of Clubs or Organizations: No    Attends Banker Meetings: Never    Marital Status: Widowed   Family History  Problem Relation Age of Onset   Hyperlipidemia Mother    Hypertension Mother    Cancer Father        brain   Hypertension Father    Hyperlipidemia Sister    Hypertension Sister    Hyperlipidemia Brother    Hypertension Brother    Diabetes Maternal Uncle    Heart disease Maternal Grandmother    Stroke Maternal Grandfather    Diabetes Maternal Grandfather    Cancer Paternal Grandmother        ovarian   Hyperlipidemia Paternal Grandmother    Cancer Paternal Grandfather        prostate   Diabetes Paternal Grandfather    Hyperlipidemia Paternal Grandfather    Colon cancer Neg Hx    Colon  polyps Neg Hx    Crohn's disease Neg Hx    Esophageal cancer Neg Hx    Rectal cancer Neg Hx    Stomach cancer Neg Hx    Ulcerative colitis Neg Hx    Allergies  Allergen Reactions   Other Shortness Of Breath and Swelling    Walnuts and pecans  Pumpkin   Keflex [Cephalexin] Hives   Macrolides And Ketolides    Sulfa Antibiotics Nausea And Vomiting   Wellbutrin [Bupropion] Other (See Comments)    Homicidal ideation   Current Outpatient Medications  Medication Sig Dispense Refill   albuterol  (PROVENTIL  HFA) 108 (90 Base) MCG/ACT inhaler Inhale 2 puffs into the lungs every 6 (six) hours as needed for wheezing or shortness of breath (cough). 6.7 g 1   Cholecalciferol (VITAMIN D3) 1.25 MG (50000 UT) TABS Take 2,000 Units by mouth daily.     EPINEPHrine  0.3 mg/0.3 mL IJ SOAJ injection Inject 0.3 mg into the muscle as needed for anaphylaxis (call 911). 2 each 1   estradiol  (ESTRACE  VAGINAL) 0.1 MG/GM vaginal cream Apply 1/2 gram to vulva nightly for 2 weeks then decrease to 1/2 gram to vulva two nights a week. Do not use applicator. 42.5 g 1   estradiol -norethindrone  (COMBIPATCH ) 0.05-0.25 MG/DAY Place 1 patch onto the skin 2 (two) times a week. 24 patch 3   Ferrous Sulfate (IRON PO) Take 65 mg by mouth once a week.     ibuprofen (ADVIL) 200 MG tablet Take 600 mg by mouth every 6 (six) hours as needed for headache or moderate pain.     levothyroxine  (SYNTHROID ) 50 MCG tablet Take 1 tablet (50 mcg total) by mouth daily before breakfast. 90 tablet 3   loratadine (CLARITIN) 10 MG tablet Take 10 mg by mouth daily.     LORazepam  (ATIVAN ) 0.5 MG tablet Take 0.5 mg by mouth every 4 (four) hours as needed for anxiety.     montelukast  (SINGULAIR ) 10 MG tablet Take 1 tablet (10 mg total) by mouth daily. 90 tablet 1   Multiple Vitamin (MULTIVITAMIN PO) Take by mouth.     omeprazole  (PRILOSEC) 20 MG capsule Take 1 capsule (20 mg total) by mouth daily. 90 capsule 1   rosuvastatin  (CRESTOR ) 5 MG tablet Take  1 tablet (5 mg total) by mouth at bedtime. 90 tablet 3   valsartan -hydrochlorothiazide  (DIOVAN -HCT) 160-25 MG tablet Take 1 tablet by mouth daily. 90 tablet 0   vitamin B-12 (CYANOCOBALAMIN) 1000 MCG tablet Take 1,000 mcg by mouth daily.  No current facility-administered medications for this visit.   No results found.  Review of Systems:   A ROS was performed including pertinent positives and negatives as documented in the HPI.  Physical Exam :   Constitutional: NAD and appears stated age Neurological: Alert and oriented Psych: Appropriate affect and cooperative There were no vitals taken for this visit.   Comprehensive Musculoskeletal Exam:    Right shoulder with tenderness throughout the glenohumeral joint particular with internal rotation limited rotation to just back pocket as opposed to L1 on the contralateral side.  Otherwise full active forward elevation external rotation at the side   Imaging:   Xray (3 views right shoulder): Normal  MRI right shoulder: Normal  I personally reviewed and interpreted the radiographs.   Assessment and Plan:   53 y.o. female with right shoulder pain which today is more consistent with scapular bursitis.  I did discuss that I do believe this is a result of asymmetry with a tight anterior shoulder chain and a weakened posterior shoulder chain.  Given this we will plan to get her engaged with physical therapy and I will plan to see her back as needed  - Return to clinic as needed   I personally saw and evaluated the patient, and participated in the management and treatment plan.  Elspeth Parker, MD Attending Physician, Orthopedic Surgery  This document was dictated using Dragon voice recognition software. A reasonable attempt at proof reading has been made to minimize errors.

## 2024-05-22 ENCOUNTER — Ambulatory Visit: Admitting: Occupational Therapy

## 2024-05-29 ENCOUNTER — Ambulatory Visit: Admitting: Occupational Therapy

## 2024-06-10 ENCOUNTER — Ambulatory Visit: Admitting: Occupational Therapy

## 2024-06-16 ENCOUNTER — Other Ambulatory Visit (HOSPITAL_BASED_OUTPATIENT_CLINIC_OR_DEPARTMENT_OTHER): Payer: Self-pay

## 2024-06-16 ENCOUNTER — Other Ambulatory Visit: Payer: Self-pay

## 2024-06-16 ENCOUNTER — Ambulatory Visit: Admitting: Family Medicine

## 2024-06-16 ENCOUNTER — Encounter: Payer: Self-pay | Admitting: Family Medicine

## 2024-06-16 VITALS — BP 136/84 | HR 76 | Temp 98.6°F | Ht 66.5 in | Wt 269.0 lb

## 2024-06-16 DIAGNOSIS — J4530 Mild persistent asthma, uncomplicated: Secondary | ICD-10-CM | POA: Diagnosis not present

## 2024-06-16 DIAGNOSIS — I10 Essential (primary) hypertension: Secondary | ICD-10-CM

## 2024-06-16 DIAGNOSIS — Z79899 Other long term (current) drug therapy: Secondary | ICD-10-CM | POA: Diagnosis not present

## 2024-06-16 DIAGNOSIS — J301 Allergic rhinitis due to pollen: Secondary | ICD-10-CM | POA: Diagnosis not present

## 2024-06-16 DIAGNOSIS — E039 Hypothyroidism, unspecified: Secondary | ICD-10-CM

## 2024-06-16 DIAGNOSIS — K219 Gastro-esophageal reflux disease without esophagitis: Secondary | ICD-10-CM | POA: Diagnosis not present

## 2024-06-16 DIAGNOSIS — Z6841 Body Mass Index (BMI) 40.0 and over, adult: Secondary | ICD-10-CM

## 2024-06-16 DIAGNOSIS — E7800 Pure hypercholesterolemia, unspecified: Secondary | ICD-10-CM | POA: Diagnosis not present

## 2024-06-16 DIAGNOSIS — F419 Anxiety disorder, unspecified: Secondary | ICD-10-CM | POA: Diagnosis not present

## 2024-06-16 MED ORDER — PHENTERMINE HCL 37.5 MG PO CAPS
37.5000 mg | ORAL_CAPSULE | ORAL | 0 refills | Status: DC
  Filled 2024-06-16: qty 30, 30d supply, fill #0

## 2024-06-16 MED ORDER — OMEPRAZOLE 20 MG PO CPDR
20.0000 mg | DELAYED_RELEASE_CAPSULE | Freq: Every day | ORAL | 1 refills | Status: AC
  Filled 2024-06-16 – 2024-06-30 (×2): qty 90, 90d supply, fill #0

## 2024-06-16 MED ORDER — VALSARTAN-HYDROCHLOROTHIAZIDE 160-25 MG PO TABS
1.0000 | ORAL_TABLET | Freq: Every day | ORAL | 1 refills | Status: AC
  Filled 2024-06-16: qty 90, 90d supply, fill #0

## 2024-06-16 NOTE — Progress Notes (Signed)
 "  Established Patient Office Visit   Subjective:  Patient ID: Yolanda Mendez, female    DOB: 1970-06-28  Age: 54 y.o. MRN: [id number]  Chief Complaint  Patient presents with   Medical Management of Chronic Issues    3 month follow up     HPI Asthma: Chronic. Patient is prescribed Albuterol  Inhaler, 2 puffs every 6 hours as needed. Last used: This past weekend, a little wheeze.    Anxiety: Patient is prescribed Lorazepam  0.5mg  every 4 hours as needed for anxiety. She takes it only when flying. Patient scored 0 on GAD-7 and PHQ-9.   Seasonal allergies/Asthma: Patient is prescribed Loratadine 10mg  and Montelukast  10mg  tablet. Effective.    GERD: Patient is prescribed Omeprazole  20mg  daily. Effective.    Weight Loss: Patient is wanting to go bacl on Phentermine  37.5mg  tablet daily. Patient takes it 3 months and then comes off medication. She reports it really curves her appetite. Tried Ozempic and Mounjaro. Last prescribed on 02/13/2024.    Hyperlipidemia: Patient is prescribed Rosuvastatin  5mg  daily. Has no complaints with taking medication.  Lab Results  Component Value Date   CHOL 145 02/29/2024   HDL 48 (L) 02/29/2024   LDLCALC 76 02/29/2024   TRIG 128 02/29/2024   CHOLHDL 3.0 02/29/2024      HTN: Patient is prescribed Valsartan /HCTZ 160-25mg  daily. No mention of CP, SHOB, HA, dizziness, lightheadedness, or lower extremity edema.  BP Readings from Last 3 Encounters:  06/16/24 136/84  03/13/24 124/80  02/29/24 126/84     ROS See HPI above     Objective:   BP 136/84   Pulse 76   Temp 98.6 F (37 C) (Oral)   Ht 5' 6.5 (1.689 m)   Wt 269 lb (122 kg)   SpO2 99%   BMI 42.77 kg/m  BP Readings from Last 3 Encounters:  06/16/24 136/84  03/13/24 124/80  02/29/24 126/84   Wt Readings from Last 3 Encounters:  06/16/24 269 lb (122 kg)  03/13/24 259 lb (117.5 kg)  02/29/24 254 lb (115.2 kg)     Physical Exam Vitals reviewed.  Constitutional:      General:  She is not in acute distress.    Appearance: Normal appearance. She is morbidly obese. She is not ill-appearing, toxic-appearing or diaphoretic.  HENT:     Head: Normocephalic and atraumatic.  Eyes:     General:        Right eye: No discharge.        Left eye: No discharge.     Conjunctiva/sclera: Conjunctivae normal.  Cardiovascular:     Rate and Rhythm: Normal rate and regular rhythm.     Heart sounds: Normal heart sounds. No murmur heard.    No friction rub. No gallop.  Pulmonary:     Effort: Pulmonary effort is normal. No respiratory distress.     Breath sounds: Normal breath sounds.  Musculoskeletal:        General: Normal range of motion.  Skin:    General: Skin is warm and dry.  Neurological:     General: No focal deficit present.     Mental Status: She is alert and oriented to person, place, and time. Mental status is at baseline.  Psychiatric:        Mood and Affect: Mood normal.        Behavior: Behavior normal.        Thought Content: Thought content normal.        Judgment: Judgment normal.  Assessment & Plan:  Gastroesophageal reflux disease without esophagitis Assessment & Plan: Stable. Continue with Omeprazole  20mg  daily. Refilled medication.   Orders: -     Omeprazole ; Take 1 capsule (20 mg total) by mouth daily.  Dispense: 90 capsule; Refill: 1  Hypertension, unspecified type Assessment & Plan: Stable. Continue Valsartan /HCTZ 160-25mg  daily. Refilled medication. Last CMP was on 02/29/2024 with stable potassium level and kidney function.   Orders: -     Valsartan -hydroCHLOROthiazide ; Take 1 tablet by mouth daily.  Dispense: 90 tablet; Refill: 1  Medication management -     DRUG MONITOR, PANEL 1, SCREEN, URINE  Body mass index 40.0-44.9, adult (HCC) Assessment & Plan: Stable. Restart taking Phentermine  37.5mg  daily for 3 months.[place] PDMP reviewed with last refill on 02/23/2024. Controlled substance agreement is UTD (12/2023).   Orders: -     DRUG  MONITOR, PANEL 1, SCREEN, URINE -     Phentermine  HCl; Take 1 capsule (37.5 mg total) by mouth every morning.  Dispense: 30 capsule; Refill: 0  Seasonal allergic rhinitis due to pollen Assessment & Plan: Stable. Continue Montelukast  10mg  daily and Loratadine 10mg  daily.     Mild persistent asthma without complication Assessment & Plan: Stable. Continue and refilled Albuterol  Inhaler, 2 puffs every 6 hours as needed for wheezing, SHOB, and cough.    Hypothyroidism, unspecified type Assessment & Plan: -Managed by Dr. Stefano Butts with [facility].    High cholesterol Assessment & Plan: Stable. Continue Rosuvastatin  5 mg daily. Last CMP was on 02/29/2024 with stable liver function enzymes. Last lipid panel was in September. Not fasting today.    Anxiety with flying Assessment & Plan: Stable. Continue Lorazepam  0.5mg  every 4 hours as needed for anxiety Ordered urine drug screen for management of Lorazepam . This will need to be completed periodically. Signed controlled substance agreement that will need to be renewed yearly. No refilled needed.    1.Review health maintenance:  -Cologuard: Sept 2024  -Zoster vaccine: Had with prior PCP -PNA vaccine: Had with prior PCP -HIV and Hep C: Previously had with prior PCP -Tdap: Had with prior PCP  Return in about 3 months (around 09/14/2024) for chronic management-fasting.   Andriana Casa, NP "

## 2024-06-16 NOTE — Assessment & Plan Note (Signed)
 Stable. Continue Rosuvastatin  5 mg daily. Last CMP was on 02/29/2024 with stable liver function enzymes. Last lipid panel was in September. Not fasting today.

## 2024-06-16 NOTE — Assessment & Plan Note (Signed)
 Stable. Continue and refilled Albuterol Inhaler, 2 puffs every 6 hours as needed for wheezing, SHOB, and cough.

## 2024-06-16 NOTE — Assessment & Plan Note (Signed)
 Stable. Continue with Omeprazole  20mg  daily. Refilled medication.

## 2024-06-16 NOTE — Assessment & Plan Note (Signed)
 Stable. Continue Lorazepam  0.5mg  every 4 hours as needed for anxiety Ordered urine drug screen for management of Lorazepam . This will need to be completed periodically. Signed controlled substance agreement that will need to be renewed yearly. No refilled needed.

## 2024-06-16 NOTE — Assessment & Plan Note (Addendum)
 Stable. Continue Montelukast  10mg  daily and Loratadine 10mg  daily.

## 2024-06-16 NOTE — Assessment & Plan Note (Signed)
 Stable. Continue Valsartan /HCTZ 160-25mg  daily. Refilled medication. Last CMP was on 02/29/2024 with stable potassium level and kidney function.

## 2024-06-16 NOTE — Patient Instructions (Addendum)
-  It was great to see you today. -Continue all medications. Refilled Omeprazole  and Valsartan -Hydrochlorothiazide .  -Restarted Phentermine  37.5mg  daily for weight loss. Will take it daily for 3 months. Ordered urine drug screen for management of medication.  -Follow up in 3 months for controlled substance.

## 2024-06-16 NOTE — Assessment & Plan Note (Signed)
 Stable. Restart taking Phentermine  37.5mg  daily for 3 months.[place] PDMP reviewed with last refill on 02/23/2024. Controlled substance agreement is UTD (12/2023).

## 2024-06-16 NOTE — Assessment & Plan Note (Signed)
-  Managed by Dr. Stefano Butts with [facility].

## 2024-06-17 LAB — DRUG MONITOR, PANEL 1, SCREEN, URINE
Amphetamines: NEGATIVE ng/mL
Barbiturates: NEGATIVE ng/mL
Benzodiazepines: NEGATIVE ng/mL
Cocaine Metabolite: NEGATIVE ng/mL
Creatinine: 27.2 mg/dL
Marijuana Metabolite: NEGATIVE ng/mL
Methadone Metabolite: NEGATIVE ng/mL
Opiates: NEGATIVE ng/mL
Oxidant: NEGATIVE ug/mL
Oxycodone: NEGATIVE ng/mL
Phencyclidine: NEGATIVE ng/mL
pH: 7.2 (ref 4.5–9.0)

## 2024-06-17 LAB — DM TEMPLATE

## 2024-06-18 ENCOUNTER — Ambulatory Visit: Payer: Self-pay | Admitting: Family Medicine

## 2024-06-25 ENCOUNTER — Encounter: Payer: Self-pay | Admitting: Family Medicine

## 2024-06-26 ENCOUNTER — Other Ambulatory Visit (HOSPITAL_BASED_OUTPATIENT_CLINIC_OR_DEPARTMENT_OTHER): Payer: Self-pay

## 2024-06-26 MED ORDER — CELECOXIB 100 MG PO CAPS
100.0000 mg | ORAL_CAPSULE | Freq: Two times a day (BID) | ORAL | 0 refills | Status: AC
  Filled 2024-06-26: qty 40, 20d supply, fill #0

## 2024-06-27 ENCOUNTER — Encounter: Payer: Self-pay | Admitting: Family Medicine

## 2024-06-27 ENCOUNTER — Other Ambulatory Visit (HOSPITAL_BASED_OUTPATIENT_CLINIC_OR_DEPARTMENT_OTHER): Payer: Self-pay

## 2024-06-27 ENCOUNTER — Ambulatory Visit

## 2024-06-27 ENCOUNTER — Ambulatory Visit: Admitting: Family Medicine

## 2024-06-27 VITALS — BP 130/84 | HR 100 | Temp 97.8°F | Ht 66.6 in | Wt 269.0 lb

## 2024-06-27 DIAGNOSIS — M25561 Pain in right knee: Secondary | ICD-10-CM | POA: Diagnosis not present

## 2024-06-27 MED ORDER — METHYLPREDNISOLONE 4 MG PO TBPK
ORAL_TABLET | ORAL | 0 refills | Status: AC
  Filled 2024-06-27: qty 21, 6d supply, fill #0

## 2024-06-27 NOTE — Progress Notes (Signed)
" ° °  Established Patient Office Visit   Subjective:  Patient ID: Yolanda Mendez, female    DOB: [birth year]  Age: 54 y.o. MRN: [id number]  Chief Complaint  Patient presents with   Knee Pain    HPI Patient is complaining of right knee pain. She reports pain started after trying to get up from sitting on the side of her mothers hospital bed on 06/08/2024. She felt like she may fall when getting up from the bed. Pain is located on both sides of the knee, but more on the medial side. Constant pain, described as throbbing when sitting, and stabbing when ambulating. Nothing makes the pain better or worse.  Denies any numbness or tingling.   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  Also, tried Ibuprofen and iced it a few times with no relief.   ROS See HPI above     Objective:   BP 130/84   Pulse 100   Temp 97.8 F (36.6 C) (Oral)   Ht 5' 6.6 (1.692 m)   Wt 269 lb (122 kg)   SpO2 98%   BMI 42.64 kg/m    Physical Exam Vitals reviewed.  Constitutional:      General: She is not in acute distress.    Appearance: Normal appearance. She is obese. She is not ill-appearing, toxic-appearing or diaphoretic.  HENT:     Head: Normocephalic and atraumatic.  Eyes:     General:        Right eye: No discharge.        Left eye: No discharge.     Conjunctiva/sclera: Conjunctivae normal.  Cardiovascular:     Rate and Rhythm: Normal rate.  Pulmonary:     Effort: Pulmonary effort is normal. No respiratory distress.     Breath sounds: Normal breath sounds.  Musculoskeletal:        General: Normal range of motion.     Right knee: Swelling (Lower knee to lateral side) and crepitus (Mild) present. No deformity or erythema. Normal range of motion. Tenderness present over the medial joint line and lateral joint line.  Skin:    General: Skin is warm and dry.  Neurological:     General: No focal deficit present.     Mental Status: She is  alert and oriented to person, place, and time. Mental status is at baseline.  Psychiatric:        Mood and Affect: Mood normal.        Behavior: Behavior normal.        Thought Content: Thought content normal.        Judgment: Judgment normal.      Assessment & Plan:  Acute pain of right knee -     DG Knee 1-2 Views Right; Future -     methylPREDNISolone ; Take as directed on packaging.  Dispense: 21 tablet; Refill: 0  -Ordered right knee x-ray due to pain. Office will call with lab results and will be available via MyChart. -Prescribed Methylprednisolone  dose pack, take as directed for pain and swelling of knee. Do not take NSAIDS (Ibuprofen, Advil, Aleve, Naproxen, or Celebrex ) while taking medication. -Send a MyChart after taking medication if not improved, will refer to orthopedic.   Keyah Blizard, NP "

## 2024-06-27 NOTE — Patient Instructions (Addendum)
-  It was good to see you today.  -Ordered right knee x-ray due to pain. Office will call with lab results and will be available via MyChart. -Prescribed Methylprednisolone  dose pack, take as directed for pain and swelling of knee. Do not take NSAIDS (Ibuprofen, Advil, Aleve, Naproxen, or Celebrex ) while taking medication. -Send a MyChart after taking medication if not improved, will refer to orthopedic.

## 2024-06-28 ENCOUNTER — Other Ambulatory Visit (HOSPITAL_BASED_OUTPATIENT_CLINIC_OR_DEPARTMENT_OTHER): Payer: Self-pay

## 2024-06-30 ENCOUNTER — Other Ambulatory Visit (HOSPITAL_BASED_OUTPATIENT_CLINIC_OR_DEPARTMENT_OTHER): Payer: Self-pay

## 2024-07-03 ENCOUNTER — Ambulatory Visit: Payer: Self-pay | Admitting: Family Medicine

## 2024-07-15 ENCOUNTER — Other Ambulatory Visit: Payer: Self-pay

## 2024-07-15 ENCOUNTER — Encounter (HOSPITAL_BASED_OUTPATIENT_CLINIC_OR_DEPARTMENT_OTHER): Payer: Self-pay | Admitting: Physical Therapy

## 2024-07-15 ENCOUNTER — Other Ambulatory Visit: Payer: Self-pay | Admitting: Family Medicine

## 2024-07-15 ENCOUNTER — Other Ambulatory Visit (HOSPITAL_BASED_OUTPATIENT_CLINIC_OR_DEPARTMENT_OTHER): Payer: Self-pay

## 2024-07-15 ENCOUNTER — Ambulatory Visit (HOSPITAL_BASED_OUTPATIENT_CLINIC_OR_DEPARTMENT_OTHER): Admitting: Physical Therapy

## 2024-07-15 ENCOUNTER — Encounter: Payer: Self-pay | Admitting: Family Medicine

## 2024-07-15 DIAGNOSIS — M62838 Other muscle spasm: Secondary | ICD-10-CM

## 2024-07-15 DIAGNOSIS — M25511 Pain in right shoulder: Secondary | ICD-10-CM

## 2024-07-15 DIAGNOSIS — M6281 Muscle weakness (generalized): Secondary | ICD-10-CM

## 2024-07-15 DIAGNOSIS — Z6841 Body Mass Index (BMI) 40.0 and over, adult: Secondary | ICD-10-CM

## 2024-07-15 DIAGNOSIS — M5412 Radiculopathy, cervical region: Secondary | ICD-10-CM

## 2024-07-15 DIAGNOSIS — M25561 Pain in right knee: Secondary | ICD-10-CM

## 2024-07-15 MED ORDER — PHENTERMINE HCL 37.5 MG PO CAPS
37.5000 mg | ORAL_CAPSULE | ORAL | 0 refills | Status: AC
  Filled 2024-07-15: qty 30, 30d supply, fill #0

## 2024-07-15 NOTE — Progress Notes (Signed)
 Refill on Phentermine  37.5mg  daily. UDS and controlled substance agreement UTD. 2nd refill out of 3.

## 2024-07-16 ENCOUNTER — Encounter (HOSPITAL_BASED_OUTPATIENT_CLINIC_OR_DEPARTMENT_OTHER): Payer: Self-pay | Admitting: Physical Therapy

## 2024-07-23 ENCOUNTER — Encounter (HOSPITAL_BASED_OUTPATIENT_CLINIC_OR_DEPARTMENT_OTHER): Payer: Self-pay

## 2024-08-15 ENCOUNTER — Encounter (HOSPITAL_BASED_OUTPATIENT_CLINIC_OR_DEPARTMENT_OTHER): Payer: Self-pay

## 2024-08-20 ENCOUNTER — Encounter (HOSPITAL_BASED_OUTPATIENT_CLINIC_OR_DEPARTMENT_OTHER): Payer: Self-pay | Admitting: Physical Therapy

## 2024-08-22 ENCOUNTER — Encounter (HOSPITAL_BASED_OUTPATIENT_CLINIC_OR_DEPARTMENT_OTHER): Payer: Self-pay

## 2024-08-25 ENCOUNTER — Encounter (HOSPITAL_BASED_OUTPATIENT_CLINIC_OR_DEPARTMENT_OTHER): Payer: Self-pay

## 2024-08-27 ENCOUNTER — Ambulatory Visit: Admitting: Physician Assistant

## 2024-09-02 ENCOUNTER — Ambulatory Visit: Admitting: Internal Medicine

## 2024-09-17 ENCOUNTER — Ambulatory Visit: Admitting: Family Medicine

## 2024-10-16 ENCOUNTER — Ambulatory Visit: Admitting: Obstetrics and Gynecology
